# Patient Record
Sex: Male | Born: 1945 | Race: White | Hispanic: No | Marital: Married | Smoking: Former smoker
Health system: Southern US, Community
[De-identification: ages and names within clinical notes are randomized; demographics above are authoritative.]

## PROBLEM LIST (undated history)

## (undated) DIAGNOSIS — I8290 Acute embolism and thrombosis of unspecified vein: Secondary | ICD-10-CM

## (undated) DIAGNOSIS — M1990 Unspecified osteoarthritis, unspecified site: Secondary | ICD-10-CM

## (undated) HISTORY — PX: NECK SURGERY: SHX720

## (undated) HISTORY — PX: VASCULAR SURGERY: SHX849

---

## 1995-10-30 DIAGNOSIS — I8290 Acute embolism and thrombosis of unspecified vein: Secondary | ICD-10-CM

## 1995-10-30 HISTORY — DX: Acute embolism and thrombosis of unspecified vein: I82.90

## 2013-08-06 DIAGNOSIS — Z23 Encounter for immunization: Secondary | ICD-10-CM | POA: Diagnosis not present

## 2013-10-15 ENCOUNTER — Ambulatory Visit (HOSPITAL_COMMUNITY)
Admission: RE | Admit: 2013-10-15 | Discharge: 2013-10-15 | Disposition: A | Discharge status: Home / Self Care | Payer: Medicare Other | Source: Ambulatory Visit | Attending: Internal Medicine | Admitting: Internal Medicine

## 2013-10-15 ENCOUNTER — Other Ambulatory Visit (HOSPITAL_COMMUNITY): Payer: Self-pay | Admitting: Internal Medicine

## 2013-10-15 DIAGNOSIS — M79609 Pain in unspecified limb: Secondary | ICD-10-CM | POA: Diagnosis not present

## 2013-10-15 DIAGNOSIS — M1990 Unspecified osteoarthritis, unspecified site: Secondary | ICD-10-CM | POA: Diagnosis not present

## 2013-10-15 DIAGNOSIS — M19049 Primary osteoarthritis, unspecified hand: Secondary | ICD-10-CM | POA: Diagnosis not present

## 2013-10-15 DIAGNOSIS — M79644 Pain in right finger(s): Secondary | ICD-10-CM

## 2014-08-31 DIAGNOSIS — Z23 Encounter for immunization: Secondary | ICD-10-CM | POA: Diagnosis not present

## 2015-08-02 DIAGNOSIS — Z23 Encounter for immunization: Secondary | ICD-10-CM | POA: Diagnosis not present

## 2015-08-10 DIAGNOSIS — R7301 Impaired fasting glucose: Secondary | ICD-10-CM | POA: Diagnosis not present

## 2015-08-10 DIAGNOSIS — Z125 Encounter for screening for malignant neoplasm of prostate: Secondary | ICD-10-CM | POA: Diagnosis not present

## 2015-08-10 DIAGNOSIS — Z0000 Encounter for general adult medical examination without abnormal findings: Secondary | ICD-10-CM | POA: Diagnosis not present

## 2015-08-18 DIAGNOSIS — I491 Atrial premature depolarization: Secondary | ICD-10-CM | POA: Diagnosis not present

## 2015-08-18 DIAGNOSIS — Z6832 Body mass index (BMI) 32.0-32.9, adult: Secondary | ICD-10-CM | POA: Diagnosis not present

## 2015-08-18 DIAGNOSIS — Z23 Encounter for immunization: Secondary | ICD-10-CM | POA: Diagnosis not present

## 2015-08-18 DIAGNOSIS — R7301 Impaired fasting glucose: Secondary | ICD-10-CM | POA: Diagnosis not present

## 2015-08-22 ENCOUNTER — Encounter (INDEPENDENT_AMBULATORY_CARE_PROVIDER_SITE_OTHER): Payer: Self-pay | Admitting: *Deleted

## 2015-08-25 DIAGNOSIS — D225 Melanocytic nevi of trunk: Secondary | ICD-10-CM | POA: Diagnosis not present

## 2015-08-25 DIAGNOSIS — D485 Neoplasm of uncertain behavior of skin: Secondary | ICD-10-CM | POA: Diagnosis not present

## 2015-11-15 ENCOUNTER — Other Ambulatory Visit (INDEPENDENT_AMBULATORY_CARE_PROVIDER_SITE_OTHER): Payer: Self-pay | Admitting: *Deleted

## 2015-11-15 ENCOUNTER — Encounter (INDEPENDENT_AMBULATORY_CARE_PROVIDER_SITE_OTHER): Payer: Self-pay | Admitting: *Deleted

## 2015-11-15 DIAGNOSIS — Z1211 Encounter for screening for malignant neoplasm of colon: Secondary | ICD-10-CM

## 2015-12-08 ENCOUNTER — Encounter (INDEPENDENT_AMBULATORY_CARE_PROVIDER_SITE_OTHER): Payer: Self-pay | Admitting: *Deleted

## 2015-12-08 ENCOUNTER — Other Ambulatory Visit (INDEPENDENT_AMBULATORY_CARE_PROVIDER_SITE_OTHER): Payer: Self-pay | Admitting: *Deleted

## 2015-12-08 MED ORDER — PEG 3350-KCL-NA BICARB-NACL 420 G PO SOLR: 4000.0000 mL | Freq: Once | ORAL | Status: DC

## 2015-12-08 NOTE — Telephone Encounter (Signed)
Patient needs trilyte 

## 2015-12-16 ENCOUNTER — Telehealth (INDEPENDENT_AMBULATORY_CARE_PROVIDER_SITE_OTHER): Payer: Self-pay | Admitting: *Deleted

## 2015-12-16 NOTE — Telephone Encounter (Signed)
Referring MD/PCP: fagan   Procedure: tcs  Reason/Indication:  screeing  Has patient had this procedure before?  Yes, over 10 yrs ago  If so, when, by whom and where?    Is there a family history of colon cancer?  no  Who?  What age when diagnosed?    Is patient diabetic?   no      Does patient have prosthetic heart valve or mechanical valve?  no  Do you have a pacemaker?  no  Has patient ever had endocarditis? no  Has patient had joint replacement within last 12 months?  no  Does patient tend to be constipated or take laxatives? no  Does patient have a history of alcohol/drug use?  no  Is patient on Coumadin, Plavix and/or Aspirin? yes  Medications: asa 81 mg twice a week  Allergies: nkda  Medication Adjustment: asa 2 days  Procedure date & time: 01/11/16 at 730

## 2015-12-20 NOTE — Telephone Encounter (Signed)
agree

## 2015-12-26 DIAGNOSIS — B079 Viral wart, unspecified: Secondary | ICD-10-CM | POA: Diagnosis not present

## 2015-12-26 DIAGNOSIS — D485 Neoplasm of uncertain behavior of skin: Secondary | ICD-10-CM | POA: Diagnosis not present

## 2016-01-11 ENCOUNTER — Encounter (HOSPITAL_COMMUNITY)
Admission: RE | Disposition: A | Discharge status: Home / Self Care | Payer: Self-pay | Source: Ambulatory Visit | Attending: Internal Medicine

## 2016-01-11 ENCOUNTER — Ambulatory Visit (HOSPITAL_COMMUNITY)
Admission: RE | Admit: 2016-01-11 | Discharge: 2016-01-11 | Disposition: A | Discharge status: Home / Self Care | Payer: Medicare Other | Source: Ambulatory Visit | Attending: Internal Medicine | Admitting: Internal Medicine

## 2016-01-11 ENCOUNTER — Encounter (HOSPITAL_COMMUNITY): Payer: Self-pay | Admitting: *Deleted

## 2016-01-11 DIAGNOSIS — K5730 Diverticulosis of large intestine without perforation or abscess without bleeding: Secondary | ICD-10-CM | POA: Insufficient documentation

## 2016-01-11 DIAGNOSIS — Z1211 Encounter for screening for malignant neoplasm of colon: Secondary | ICD-10-CM | POA: Diagnosis not present

## 2016-01-11 DIAGNOSIS — K648 Other hemorrhoids: Secondary | ICD-10-CM | POA: Insufficient documentation

## 2016-01-11 DIAGNOSIS — M1991 Primary osteoarthritis, unspecified site: Secondary | ICD-10-CM | POA: Diagnosis not present

## 2016-01-11 DIAGNOSIS — Z87891 Personal history of nicotine dependence: Secondary | ICD-10-CM | POA: Diagnosis not present

## 2016-01-11 DIAGNOSIS — Z7982 Long term (current) use of aspirin: Secondary | ICD-10-CM | POA: Diagnosis not present

## 2016-01-11 DIAGNOSIS — Q438 Other specified congenital malformations of intestine: Secondary | ICD-10-CM | POA: Diagnosis not present

## 2016-01-11 HISTORY — DX: Acute embolism and thrombosis of unspecified vein: I82.90

## 2016-01-11 HISTORY — PX: COLONOSCOPY: SHX5424

## 2016-01-11 HISTORY — DX: Unspecified osteoarthritis, unspecified site: M19.90

## 2016-01-11 SURGERY — COLONOSCOPY
Anesthesia: Moderate Sedation

## 2016-01-11 MED ORDER — MEPERIDINE HCL 50 MG/ML IJ SOLN
INTRAMUSCULAR | Status: DC | PRN
  Administered 2016-01-11 (×3): 25 mg via INTRAVENOUS

## 2016-01-11 MED ORDER — SODIUM CHLORIDE 0.9 % IV SOLN
INTRAVENOUS | Status: DC
  Administered 2016-01-11: 1000 mL via INTRAVENOUS

## 2016-01-11 MED ORDER — SIMETHICONE 40 MG/0.6ML PO SUSP
ORAL | Status: DC | PRN
  Administered 2016-01-11: 07:00:00

## 2016-01-11 MED ORDER — MIDAZOLAM HCL 5 MG/5ML IJ SOLN
INTRAMUSCULAR | Status: DC | PRN
  Administered 2016-01-11 (×5): 2 mg via INTRAVENOUS

## 2016-01-11 NOTE — Op Note (Signed)
[facility] Patient Name: Andrew Newton Procedure Date: 01/11/2016 7:25 AM MRN: [id number] Date of Birth: 1946/05/30 Attending MD: Hildred Laser , MD CSN: [id number] Age: 70 Admit Type: Outpatient Procedure:                Colonoscopy Indications:              Screening for colorectal malignant neoplasm Providers:                Hildred Laser, MD, Lurline Del, RN, Isabella Stalling,                            Technician Referring MD:             Asencion Noble, MD Medicines:                Meperidine 75 mg IV, Midazolam 10 mg IV Complications:            No immediate complications. Estimated Blood Loss:     Estimated blood loss: none. Procedure:                Pre-Anesthesia Assessment:                           - Prior to the procedure, a History and Physical                            was performed, and patient medications and                            allergies were reviewed. The patient's tolerance of                            previous anesthesia was also reviewed. The risks                            and benefits of the procedure and the sedation                            options and risks were discussed with the patient.                            All questions were answered, and informed consent                            was obtained. Prior Anticoagulants: The patient                            last took aspirin 3 days prior to the procedure.                            ASA Grade Assessment: I - A normal, healthy                            patient. After reviewing the risks and benefits,  the patient was deemed in satisfactory condition to                            undergo the procedure.                           After obtaining informed consent, the colonoscope                            was passed under direct vision. Throughout the                            procedure, the patient's blood pressure, pulse, and                            oxygen  saturations were monitored continuously. The                            [id number]) scope was introduced through                            the anus and advanced to the the hepatic flexure.                            The [id number]) scope was introduced                            through the and advanced to the. The colonoscopy                            was technically difficult and complex due to                            significant looping. Successful completion of the                            procedure was aided by changing the patient to a                            supine position. The patient tolerated the                            procedure well. The quality of the bowel                            preparation was excellent. The rectum was                            photographed. Scope In: 7:44:33 AM Scope Out: 8:22:49 AM Total Procedure Duration: 0 hours 38 minutes 16 seconds  Findings:      Scattered medium-mouthed diverticula were found in the sigmoid colon.      Non-bleeding internal hemorrhoids were found during retroflexion. The       hemorrhoids were small. Impression:               -  Diverticulosis in the sigmoid colon.                           - Non-bleeding internal hemorrhoids.                           - No specimens collected.                           - Incomplete exam to hepatic flexure Moderate Sedation:      Moderate (conscious) sedation was administered by the endoscopy nurse       and supervised by the endoscopist. The following parameters were       monitored: oxygen saturation, heart rate, blood pressure, CO2       capnography and response to care. Total physician intraservice time was       46 minutes. Recommendation:           - Patient has a contact number available for                            emergencies. The signs and symptoms of potential                            delayed complications were discussed with the                             patient. Return to normal activities tomorrow.                            Written discharge instructions were provided to the                            patient.                           - High fiber diet today.                           - Perform a virtual colonoscopy in 4 weeks.                           - Continue present medications. Procedure Code(s):        --- Professional ---                           [phone], Colonoscopy, flexible; diagnostic, including                            collection of specimen(s) by brushing or washing,                            when performed (separate procedure)                           99153, Moderate sedation services; each additional  15 minutes intraservice time                           99153, Moderate sedation services; each additional                            15 minutes intraservice time                           G0500, Moderate sedation services provided by the                            same physician or other qualified health care                            professional performing a gastrointestinal                            endoscopic service that sedation supports,                            requiring the presence of an independent trained                            observer to assist in the monitoring of the                            patient's level of consciousness and physiological                            status; initial 15 minutes of intra-service time;                            patient age 51 years or older (additional time may                            be reported with [phone], as appropriate) Diagnosis Code(s):        --- Professional ---                           Z12.11, Encounter for screening for malignant                            neoplasm of colon                           K64.8, Other hemorrhoids                           K57.30, Diverticulosis of large intestine without                             perforation or abscess without bleeding CPT copyright 2016 American Medical Association. All rights reserved. The codes documented in this report are preliminary and upon coder review may  be revised to meet current compliance requirements.  Hildred Laser, MD Hildred Laser, MD 01/11/2016 8:44:09 AM This report has been signed electronically. Number of Addenda: 0

## 2016-01-11 NOTE — H&P (Signed)
Tayten Chubb is an 70 y.o. male.   Chief Complaint: Patient is here for colonoscopy. HPI: Patient is 69 year old Caucasian male who is in for screening colonoscopy. Last exam was normal 2 years ago. He denies abdominal pain change in bowel habits or rectal bleeding. History is negative for CRC.  Past Medical History  Diagnosis Date  . Arthritis   . Blood clot in vein 1997    Past Surgical History  Procedure Laterality Date  . Neck surgery    . Vascular surgery      History reviewed. No pertinent family history. Social History:  reports that he has quit smoking. His smoking use included Cigarettes. He has a 1.25 pack-year smoking history. He does not have any smokeless tobacco history on file. He reports that he drinks alcohol. He reports that he does not use illicit drugs.  Allergies: Not on File  Medications Prior to Admission  Medication Sig Dispense Refill  . aspirin EC 81 MG tablet Take 81 mg by mouth daily.    . polyethylene glycol-electrolytes (NULYTELY/GOLYTELY) 420 g solution Take 4,000 mLs by mouth once. 4000 mL 0    No results found for this or any previous visit (from the past 48 hour(s)). No results found.  ROS  Blood pressure 152/98, pulse 81, temperature 97.9 F (36.6 C), temperature source Oral, resp. rate 15, height 6' (1.829 m), weight 220 lb (99.791 kg), SpO2 98 %. Physical Exam  Constitutional: He appears well-developed and well-nourished.  HENT:  Mouth/Throat: Oropharynx is clear and moist.  Eyes: Conjunctivae are normal. No scleral icterus.  Neck: No thyromegaly present.  Cardiovascular: Normal rate, regular rhythm and normal heart sounds.   No murmur heard. Respiratory: Effort normal and breath sounds normal.  GI: Soft. He exhibits no distension and no mass. There is no tenderness.  Musculoskeletal: He exhibits no edema.  Lymphadenopathy:    He has no cervical adenopathy.  Neurological: He is alert.  Skin: Skin is warm and dry.      Assessment/Plan Average risk screening colonoscopy.  Rogene Houston, MD 01/11/2016, 7:32 AM

## 2016-01-11 NOTE — Discharge Instructions (Signed)
Resume usual medications and high fiber diet. No driving for 24 hours. Virtual colonoscopy to be scheduled. Office will call. Colonoscopy, Care After Refer to this sheet in the next few weeks. These instructions provide you with information on caring for yourself after your procedure. Your health care provider may also give you more specific instructions. Your treatment has been planned according to current medical practices, but problems sometimes occur. Call your health care provider if you have any problems or questions after your procedure. WHAT TO EXPECT AFTER THE PROCEDURE  After your procedure, it is typical to have the following:  A small amount of blood in your stool.  Moderate amounts of gas and mild abdominal cramping or bloating. HOME CARE INSTRUCTIONS  Do not drive, operate machinery, or sign important documents for 24 hours.  You may shower and resume your regular physical activities, but move at a slower pace for the first 24 hours.  Take frequent rest periods for the first 24 hours.  Walk around or put a warm pack on your abdomen to help reduce abdominal cramping and bloating.  Drink enough fluids to keep your urine clear or pale yellow.  You may resume your normal diet as instructed by your health care provider. Avoid heavy or fried foods that are hard to digest.  Avoid drinking alcohol for 24 hours or as instructed by your health care provider.  Only take over-the-counter or prescription medicines as directed by your health care provider.  If a tissue sample (biopsy) was taken during your procedure:  Do not take aspirin or blood thinners for 7 days, or as instructed by your health care provider.  Do not drink alcohol for 7 days, or as instructed by your health care provider.  Eat soft foods for the first 24 hours. SEEK MEDICAL CARE IF: You have persistent spotting of blood in your stool 2-3 days after the procedure. SEEK IMMEDIATE MEDICAL CARE IF:  You have  more than a small spotting of blood in your stool.  You pass large blood clots in your stool.  Your abdomen is swollen (distended).  You have nausea or vomiting.  You have a fever.  You have increasing abdominal pain that is not relieved with medicine.   This information is not intended to replace advice given to you by your health care provider. Make sure you discuss any questions you have with your health care provider.   Document Released: 05/29/2004 Document Revised: 08/05/2013 Document Reviewed: 06/22/2013 Elsevier Interactive Patient Education 2016 [employer]. Diverticulosis Diverticulosis is the condition that develops when small pouches (diverticula) form in the wall of your colon. Your colon, or large intestine, is where water is absorbed and stool is formed. The pouches form when the inside layer of your colon pushes through weak spots in the outer layers of your colon. CAUSES  No one knows exactly what causes diverticulosis. RISK FACTORS  Being older than 7. Your risk for this condition increases with age. Diverticulosis is rare in people younger than 40 years. By age 47, almost everyone has it.  Eating a low-fiber diet.  Being frequently constipated.  Being overweight.  Not getting enough exercise.  Smoking.  Taking over-the-counter pain medicines, like aspirin and ibuprofen. SYMPTOMS  Most people with diverticulosis do not have symptoms. DIAGNOSIS  Because diverticulosis often has no symptoms, health care providers often discover the condition during an exam for other colon problems. In many cases, a health care provider will diagnose diverticulosis while using a flexible scope to  examine the colon (colonoscopy). TREATMENT  If you have never developed an infection related to diverticulosis, you may not need treatment. If you have had an infection before, treatment may include:  Eating more fruits, vegetables, and grains.  Taking a fiber  supplement.  Taking a live bacteria supplement (probiotic).  Taking medicine to relax your colon. HOME CARE INSTRUCTIONS   Drink at least 6-8 glasses of water each day to prevent constipation.  Try not to strain when you have a bowel movement.  Keep all follow-up appointments. If you have had an infection before:  Increase the fiber in your diet as directed by your health care provider or dietitian.  Take a dietary fiber supplement if your health care provider approves.  Only take medicines as directed by your health care provider. SEEK MEDICAL CARE IF:   You have abdominal pain.  You have bloating.  You have cramps.  You have not gone to the bathroom in 3 days. SEEK IMMEDIATE MEDICAL CARE IF:   Your pain gets worse.  Yourbloating becomes very bad.  You have a fever or chills, and your symptoms suddenly get worse.  You begin vomiting.  You have bowel movements that are bloody or black. MAKE SURE YOU:  Understand these instructions.  Will watch your condition.  Will get help right away if you are not doing well or get worse.   This information is not intended to replace advice given to you by your health care provider. Make sure you discuss any questions you have with your health care provider.   Document Released: 07/12/2004 Document Revised: 10/20/2013 Document Reviewed: 09/09/2013 Elsevier Interactive Patient Education [employer].

## 2016-01-12 DIAGNOSIS — X32XXXA Exposure to sunlight, initial encounter: Secondary | ICD-10-CM | POA: Diagnosis not present

## 2016-01-12 DIAGNOSIS — L570 Actinic keratosis: Secondary | ICD-10-CM | POA: Diagnosis not present

## 2016-01-12 DIAGNOSIS — L280 Lichen simplex chronicus: Secondary | ICD-10-CM | POA: Diagnosis not present

## 2016-01-13 ENCOUNTER — Telehealth (INDEPENDENT_AMBULATORY_CARE_PROVIDER_SITE_OTHER): Payer: Self-pay | Admitting: *Deleted

## 2016-01-13 ENCOUNTER — Encounter (INDEPENDENT_AMBULATORY_CARE_PROVIDER_SITE_OTHER): Payer: Self-pay | Admitting: Internal Medicine

## 2016-01-13 DIAGNOSIS — Q438 Other specified congenital malformations of intestine: Secondary | ICD-10-CM

## 2016-01-13 NOTE — Telephone Encounter (Signed)
Will do BE

## 2016-01-13 NOTE — Telephone Encounter (Signed)
Medicare won't cover virtual TCS for patient, do you want to do a barium enema instead -- please advise

## 2016-01-13 NOTE — Progress Notes (Signed)
This encounter was created in error - please disregard.

## 2016-01-16 ENCOUNTER — Encounter (HOSPITAL_COMMUNITY): Payer: Self-pay | Admitting: Internal Medicine

## 2016-01-16 ENCOUNTER — Encounter (INDEPENDENT_AMBULATORY_CARE_PROVIDER_SITE_OTHER): Payer: Self-pay | Admitting: *Deleted

## 2016-01-16 ENCOUNTER — Other Ambulatory Visit (INDEPENDENT_AMBULATORY_CARE_PROVIDER_SITE_OTHER): Payer: Self-pay | Admitting: Internal Medicine

## 2016-01-16 DIAGNOSIS — Q438 Other specified congenital malformations of intestine: Secondary | ICD-10-CM

## 2016-01-16 NOTE — Telephone Encounter (Signed)
BE scheduled 02/13/16 at 945, detailed instruction sheet mailed to patient

## 2016-02-13 ENCOUNTER — Ambulatory Visit (HOSPITAL_COMMUNITY)
Admission: RE | Admit: 2016-02-13 | Discharge: 2016-02-13 | Disposition: A | Discharge status: Home / Self Care | Payer: Medicare Other | Source: Ambulatory Visit | Attending: Internal Medicine | Admitting: Internal Medicine

## 2016-02-13 DIAGNOSIS — M5136 Other intervertebral disc degeneration, lumbar region: Secondary | ICD-10-CM | POA: Insufficient documentation

## 2016-02-13 DIAGNOSIS — Q438 Other specified congenital malformations of intestine: Secondary | ICD-10-CM | POA: Insufficient documentation

## 2016-02-13 DIAGNOSIS — K5730 Diverticulosis of large intestine without perforation or abscess without bleeding: Secondary | ICD-10-CM | POA: Diagnosis not present

## 2016-08-14 DIAGNOSIS — Z23 Encounter for immunization: Secondary | ICD-10-CM | POA: Diagnosis not present

## 2016-09-04 DIAGNOSIS — Z125 Encounter for screening for malignant neoplasm of prostate: Secondary | ICD-10-CM | POA: Diagnosis not present

## 2016-09-04 DIAGNOSIS — R7301 Impaired fasting glucose: Secondary | ICD-10-CM | POA: Diagnosis not present

## 2016-09-06 DIAGNOSIS — R7301 Impaired fasting glucose: Secondary | ICD-10-CM | POA: Diagnosis not present

## 2016-09-06 DIAGNOSIS — K4090 Unilateral inguinal hernia, without obstruction or gangrene, not specified as recurrent: Secondary | ICD-10-CM | POA: Diagnosis not present

## 2016-09-06 DIAGNOSIS — Z6835 Body mass index (BMI) 35.0-35.9, adult: Secondary | ICD-10-CM | POA: Diagnosis not present

## 2016-09-06 DIAGNOSIS — I491 Atrial premature depolarization: Secondary | ICD-10-CM | POA: Diagnosis not present

## 2016-09-06 DIAGNOSIS — Z23 Encounter for immunization: Secondary | ICD-10-CM | POA: Diagnosis not present

## 2016-10-01 DIAGNOSIS — H2513 Age-related nuclear cataract, bilateral: Secondary | ICD-10-CM | POA: Diagnosis not present

## 2017-05-14 ENCOUNTER — Encounter: Payer: Self-pay | Admitting: General Surgery

## 2017-05-14 ENCOUNTER — Ambulatory Visit (INDEPENDENT_AMBULATORY_CARE_PROVIDER_SITE_OTHER): Payer: Medicare Other | Admitting: General Surgery

## 2017-05-14 VITALS — BP 143/90 | HR 81 | Temp 98.4°F | Resp 18 | Ht 72.0 in | Wt 253.0 lb

## 2017-05-14 DIAGNOSIS — K4090 Unilateral inguinal hernia, without obstruction or gangrene, not specified as recurrent: Secondary | ICD-10-CM | POA: Diagnosis not present

## 2017-05-14 NOTE — Progress Notes (Signed)
Andrew Newton; [id number]; [birth year]   HPI Patient is a 71 year old white male who was referred to my care by Dr. Willey Newton for evaluation and treatment of a left inguinal hernia. It was first noticed on physical examination last year. Over the past few months, it has been enlarging in size and causes him discomfort when it is sticking out. It is made worse with straining or standing. It reduces on its own when lying down. The patient currently has no pain. Past Medical History:  Diagnosis Date  . Arthritis   . Blood clot in vein 1997    Past Surgical History:  Procedure Laterality Date  . COLONOSCOPY N/A 01/11/2016   Procedure: COLONOSCOPY;  Surgeon: Andrew Houston, MD;  Location: [facility];  Service: Endoscopy;  Laterality: N/A;  730  . NECK SURGERY    . VASCULAR SURGERY      History reviewed. No pertinent family history.  Current Outpatient Prescriptions on File Prior to Visit  Medication Sig Dispense Refill  . aspirin EC 81 MG tablet Take 81 mg by mouth daily.     No current facility-administered medications on file prior to visit.     No Known Allergies  History  Alcohol Use  . Yes    Comment: Occasional beer    History  Smoking Status  . Former Smoker  . Packs/day: 0.25  . Years: 5.00  . Types: Cigarettes  Smokeless Tobacco  . Never Used    Review of Systems  Constitutional: Negative.   HENT: Negative.   Eyes: Negative.   Respiratory: Negative.   Cardiovascular: Negative.   Gastrointestinal: Negative.   Genitourinary: Negative.   Musculoskeletal: Negative.   Skin: Negative.   Neurological: Negative.   Endo/Heme/Allergies: Negative.   Psychiatric/Behavioral: Negative.     Objective   Vitals:   05/14/17 1115  BP: (!) 143/90  Pulse: 81  Resp: 18  Temp: 98.4 F (36.9 C)    Physical Exam  Constitutional: He is oriented to person, place, and time and well-developed, well-nourished, and in no distress.  HENT:  Head: Normocephalic and  atraumatic.  Neck: Normal range of motion. Neck supple.  Cardiovascular: Normal rate, regular rhythm and normal heart sounds.   No murmur heard. Pulmonary/Chest: Effort normal and breath sounds normal. He has no wheezes. He has no rales.  Abdominal: Soft. Bowel sounds are normal. He exhibits no distension. There is no tenderness.  Easily reducible left inguinal hernia.  Genitourinary:  Genitourinary Comments: Genitourinary examination within normal limits.  Neurological: He is alert and oriented to person, place, and time.  Skin: Skin is warm and dry.  Vitals reviewed.   Assessment  Left inguinal hernia, reducible Plan   Patient is scheduled for left inguinal herniorrhaphy with mesh on 05/24/2017. The risks and benefits of the procedure including bleeding, infection, mesh use, and the possibility of recurrence of the hernia were fully explained to the patient, who gave informed consent.

## 2017-05-14 NOTE — Patient Instructions (Signed)

## 2017-05-14 NOTE — H&P (Addendum)
Andrew Newton; [id number]; 05/10/1946   HPI Patient is a 71 year old white male who was referred to my care by Dr. Willey Blade for evaluation and treatment of a left inguinal hernia. It was first noticed on physical examination last year. Over the past few months, it has been enlarging in size and causes him discomfort when it is sticking out. It is made worse with straining or standing. It reduces on its own when lying down. The patient currently has no pain. Past Medical History:  Diagnosis Date  . Arthritis   . Blood clot in vein 1997    Past Surgical History:  Procedure Laterality Date  . COLONOSCOPY N/A 01/11/2016   Procedure: COLONOSCOPY;  Surgeon: Rogene Houston, MD;  Location: [facility];  Service: Endoscopy;  Laterality: N/A;  730  . NECK SURGERY    . VASCULAR SURGERY      History reviewed. No pertinent family history.  Current Outpatient Prescriptions on File Prior to Visit  Medication Sig Dispense Refill  . aspirin EC 81 MG tablet Take 81 mg by mouth daily.     No current facility-administered medications on file prior to visit.     No Known Allergies  History  Alcohol Use  . Yes    Comment: Occasional beer    History  Smoking Status  . Former Smoker  . Packs/day: 0.25  . Years: 5.00  . Types: Cigarettes  Smokeless Tobacco  . Never Used    Review of Systems  Constitutional: Negative.   HENT: Negative.   Eyes: Negative.   Respiratory: Negative.   Cardiovascular: Negative.   Gastrointestinal: Negative.   Genitourinary: Negative.   Musculoskeletal: Negative.   Skin: Negative.   Neurological: Negative.   Endo/Heme/Allergies: Negative.   Psychiatric/Behavioral: Negative.     Objective   Vitals:   05/14/17 1115  BP: (!) 143/90  Pulse: 81  Resp: 18  Temp: 98.4 F (36.9 C)    Physical Exam  Constitutional: He is oriented to person, place, and time and well-developed, well-nourished, and in no distress.  HENT:  Head: Normocephalic and  atraumatic.  Neck: Normal range of motion. Neck supple.  Cardiovascular: Normal rate, regular rhythm and normal heart sounds.   No murmur heard. Pulmonary/Chest: Effort normal and breath sounds normal. He has no wheezes. He has no rales.  Abdominal: Soft. Bowel sounds are normal. He exhibits no distension. There is no tenderness.  Easily reducible left inguinal hernia.  Genitourinary:  Genitourinary Comments: Genitourinary examination within normal limits.  Neurological: He is alert and oriented to person, place, and time.  Skin: Skin is warm and dry.  Vitals reviewed.   Assessment  Left inguinal hernia, reducible Plan   Patient is scheduled for left inguinal herniorrhaphy with mesh on 05/24/2017. The risks and benefits of the procedure including bleeding, infection, mesh use, and the possibility of recurrence of the hernia were fully explained to the patient, who gave informed consent.

## 2017-05-21 NOTE — Patient Instructions (Signed)
Andrew Newton  05/21/2017     @PREFPERIOPPHARMACY @   Your procedure is scheduled on  05/24/2017   Report to [facility] at  615   A.M.  Call this number if you have problems the morning of surgery:  [phone]   Remember:  Do not eat food or drink liquids after midnight.  Take these medicines the morning of surgery with A SIP OF WATER  None   Do not wear jewelry, make-up or nail polish.  Do not wear lotions, powders, or perfumes, or deoderant.  Do not shave 48 hours prior to surgery.  Men may shave face and neck.  Do not bring valuables to the hospital.  [facility] is not responsible for any belongings or valuables.  Contacts, dentures or bridgework may not be worn into surgery.  Leave your suitcase in the car.  After surgery it may be brought to your room.  For patients admitted to the hospital, discharge time will be determined by your treatment team.  Patients discharged the day of surgery will not be allowed to drive home.   Name and phone number of your driver:   family Special instructions:  None  Please read over the following fact sheets that you were given. Anesthesia Post-op Instructions and Care and Recovery After Surgery       Open Hernia Repair, Adult Open hernia repair is a surgical procedure to fix a hernia. A hernia occurs when an internal organ or tissue pushes out through a weak spot in the abdominal wall muscles. Hernias commonly occur in the groin and around the navel. Most hernias tend to get worse over time. Often, surgery is done to prevent the hernia from becoming bigger, uncomfortable, or an emergency. Emergency surgery may be needed if abdominal contents get stuck in the opening (incarcerated hernia) or the blood supply gets cut off (strangulated hernia). In an open repair, an incision is made in the abdomen to perform the surgery. Tell a health care provider about:  Any allergies you have.  All medicines you are  taking, including vitamins, herbs, eye drops, creams, and over-the-counter medicines.  Any problems you or family members have had with anesthetic medicines.  Any blood or bone disorders you have.  Any surgeries you have had.  Any medical conditions you have, including any recent cold or flu symptoms.  Whether you are pregnant or may be pregnant. What are the risks? Generally, this is a safe procedure. However, problems may occur, including:  Long-lasting (chronic) pain.  Bleeding.  Infection.  Damage to the testicle. This can cause shrinking or swelling.  Damage to the bladder, blood vessels, intestine, or nerves near the hernia.  Trouble passing urine.  Allergic reactions to medicines.  Return of the hernia.  What happens before the procedure? Staying hydrated Follow instructions from your health care provider about hydration, which may include:  Up to 2 hours before the procedure - you may continue to drink clear liquids, such as water, clear fruit juice, black coffee, and plain tea.  Eating and drinking restrictions Follow instructions from your health care provider about eating and drinking, which may include:  8 hours before the procedure - stop eating heavy meals or foods such as meat, fried foods, or fatty foods.  6 hours before the procedure - stop eating light meals or foods, such as toast or cereal.  6 hours before the procedure - stop drinking milk or drinks  that contain milk.  2 hours before the procedure - stop drinking clear liquids.  Medicines  Ask your health care provider about: ? Changing or stopping your regular medicines. This is especially important if you are taking diabetes medicines or blood thinners. ? Taking medicines such as aspirin and ibuprofen. These medicines can thin your blood. Do not take these medicines before your procedure if your health care provider instructs you not to.  You may be given antibiotic medicine to help prevent  infection. General instructions  You may have blood tests or imaging studies.  Ask your health care provider how your surgical site will be marked or identified.  If you smoke, do not smoke for at least 2 weeks before your procedure or for as long as told by your health care provider.  Let your health care provider know if you develop a cold or any infection before your surgery.  Plan to have someone take you home from the hospital or clinic.  If you will be going home right after the procedure, plan to have someone with you for 24 hours. What happens during the procedure?  To reduce your risk of infection: ? Your health care team will wash or sanitize their hands. ? Your skin will be washed with soap. ? Hair may be removed from the surgical area.  An IV tube will be inserted into one of your veins.  You will be given one or more of the following: ? A medicine to help you relax (sedative). ? A medicine to numb the area (local anesthetic). ? A medicine to make you fall asleep (general anesthetic).  Your surgeon will make an incision over the hernia.  The tissues of the hernia will be moved back into place.  The edges of the hernia may be stitched together.  The opening in the abdominal muscles will be closed with stitches (sutures). Or, your surgeon will place a mesh patch made of manmade (synthetic) material over the opening.  The incision will be closed.  A bandage (dressing) may be placed over the incision. The procedure may vary among health care providers and hospitals. What happens after the procedure?  Your blood pressure, heart rate, breathing rate, and blood oxygen level will be monitored until the medicines you were given have worn off.  You may be given medicine for pain.  Do not drive for 24 hours if you received a sedative. This information is not intended to replace advice given to you by your health care provider. Make sure you discuss any questions you  have with your health care provider. Document Released: 04/10/2001 Document Revised: 05/04/2016 Document Reviewed: 03/28/2016 Elsevier Interactive Patient Education  2018 [place] Repair, Adult, Care After This sheet gives you information about how to care for yourself after your procedure. Your health care provider may also give you more specific instructions. If you have problems or questions, contact your health care provider. What can I expect after the procedure? After the procedure, it is common to have:  Mild discomfort.  Slight bruising.  Minor swelling.  Pain in the abdomen.  Follow these instructions at home: Incision care   Follow instructions from your health care provider about how to take care of your incision area. Make sure you: ? Wash your hands with soap and water before you change your bandage (dressing). If soap and water are not available, use hand sanitizer. ? Change your dressing as told by your health care provider. ? Leave  stitches (sutures), skin glue, or adhesive strips in place. These skin closures may need to stay in place for 2 weeks or longer. If adhesive strip edges start to loosen and curl up, you may trim the loose edges. Do not remove adhesive strips completely unless your health care provider tells you to do that.  Check your incision area every day for signs of infection. Check for: ? More redness, swelling, or pain. ? More fluid or blood. ? Warmth. ? Pus or a bad smell. Activity  Do not drive or use heavy machinery while taking prescription pain medicine. Do not drive until your health care provider approves.  Until your health care provider approves: ? Do not lift anything that is heavier than 10 lb (4.5 kg). ? Do not play contact sports.  Return to your normal activities as told by your health care provider. Ask your health care provider what activities are safe. General instructions  To prevent or treat constipation  while you are taking prescription pain medicine, your health care provider may recommend that you: ? Drink enough fluid to keep your urine clear or pale yellow. ? Take over-the-counter or prescription medicines. ? Eat foods that are high in fiber, such as fresh fruits and vegetables, whole grains, and beans. ? Limit foods that are high in fat and processed sugars, such as fried and sweet foods.  Take over-the-counter and prescription medicines only as told by your health care provider.  Do not take tub baths or go swimming until your health care provider approves.  Keep all follow-up visits as told by your health care provider. This is important. Contact a health care provider if:  You develop a rash.  You have more redness, swelling, or pain around your incision.  You have more fluid or blood coming from your incision.  Your incision feels warm to the touch.  You have pus or a bad smell coming from your incision.  You have a fever or chills.  You have blood in your stool (feces).  You have not had a bowel movement in 2-3 days.  Your pain is not controlled with medicine. Get help right away if:  You have chest pain or shortness of breath.  You feel light-headed or feel faint.  You have severe pain.  You vomit and your pain is worse. This information is not intended to replace advice given to you by your health care provider. Make sure you discuss any questions you have with your health care provider. Document Released: 05/04/2005 Document Revised: 05/04/2016 Document Reviewed: 03/28/2016 Elsevier Interactive Patient Education  2017 [place] Repair, Adult, Care After These instructions give you information about caring for yourself after your procedure. Your doctor may also give you more specific instructions. If you have problems or questions, contact your doctor. Follow these instructions at home: Surgical cut (incision) care   Follow instructions  from your doctor about how to take care of your surgical cut area. Make sure you: ? Wash your hands with soap and water before you change your bandage (dressing). If you cannot use soap and water, use hand sanitizer. ? Change your bandage as told by your doctor. ? Leave stitches (sutures), skin glue, or skin tape (adhesive) strips in place. They may need to stay in place for 2 weeks or longer. If tape strips get loose and curl up, you may trim the loose edges. Do not remove tape strips completely unless your doctor says it is okay.  Check  your surgical cut every day for signs of infection. Check for: ? More redness, swelling, or pain. ? More fluid or blood. ? Warmth. ? Pus or a bad smell. Activity  Do not drive or use heavy machinery while taking prescription pain medicine. Do not drive until your doctor says it is okay.  Until your doctor says it is okay: ? Do not lift anything that is heavier than 10 lb (4.5 kg). ? Do not play contact sports.  Return to your normal activities as told by your doctor. Ask your doctor what activities are safe. General instructions  To prevent or treat having a hard time pooping (constipation) while you are taking prescription pain medicine, your doctor may recommend that you: ? Drink enough fluid to keep your pee (urine) clear or pale yellow. ? Take over-the-counter or prescription medicines. ? Eat foods that are high in fiber, such as fresh fruits and vegetables, whole grains, and beans. ? Limit foods that are high in fat and processed sugars, such as fried and sweet foods.  Take over-the-counter and prescription medicines only as told by your doctor.  Do not take baths, swim, or use a hot tub until your doctor says it is okay.  Keep all follow-up visits as told by your doctor. This is important. Contact a doctor if:  You develop a rash.  You have more redness, swelling, or pain around your surgical cut.  You have more fluid or blood coming from  your surgical cut.  Your surgical cut feels warm to the touch.  You have pus or a bad smell coming from your surgical cut.  You have a fever or chills.  You have blood in your poop (stool).  You have not pooped in 2-3 days.  Medicine does not help your pain. Get help right away if:  You have chest pain or you are short of breath.  You feel light-headed.  You feel weak and dizzy (feel faint).  You have very bad pain.  You throw up (vomit) and your pain is worse. This information is not intended to replace advice given to you by your health care provider. Make sure you discuss any questions you have with your health care provider. Document Released: 11/05/2014 Document Revised: 05/04/2016 Document Reviewed: 03/28/2016 Elsevier Interactive Patient Education  2017 [place] Anesthesia, Adult General anesthesia is the use of medicines to make a person "go to sleep" (be unconscious) for a medical procedure. General anesthesia is often recommended when a procedure:  Is long.  Requires you to be still or in an unusual position.  Is major and can cause you to lose blood.  Is impossible to do without general anesthesia.  The medicines used for general anesthesia are called general anesthetics. In addition to making you sleep, the medicines:  Prevent pain.  Control your blood pressure.  Relax your muscles.  Tell a health care provider about:  Any allergies you have.  All medicines you are taking, including vitamins, herbs, eye drops, creams, and over-the-counter medicines.  Any problems you or family members have had with anesthetic medicines.  Types of anesthetics you have had in the past.  Any bleeding disorders you have.  Any surgeries you have had.  Any medical conditions you have.  Any history of heart or lung conditions, such as heart failure, sleep apnea, or chronic obstructive pulmonary disease (COPD).  Whether you are pregnant or may be  pregnant.  Whether you use tobacco, alcohol, marijuana, or street drugs.  Any history of Armed forces logistics/support/administrative officer.  Any history of depression or anxiety. What are the risks? Generally, this is a safe procedure. However, problems may occur, including:  Allergic reaction to anesthetics.  Lung and heart problems.  Inhaling food or liquids from your stomach into your lungs (aspiration).  Injury to nerves.  Waking up during your procedure and being unable to move (rare).  Extreme agitation or a state of mental confusion (delirium) when you wake up from the anesthetic.  Air in the bloodstream, which can lead to stroke.  These problems are more likely to develop if you are having a major surgery or if you have an advanced medical condition. You can prevent some of these complications by answering all of your health care provider's questions thoroughly and by following all pre-procedure instructions. General anesthesia can cause side effects, including:  Nausea or vomiting  A sore throat from the breathing tube.  Feeling cold or shivery.  Feeling tired, washed out, or achy.  Sleepiness or drowsiness.  Confusion or agitation.  What happens before the procedure? Staying hydrated Follow instructions from your health care provider about hydration, which may include:  Up to 2 hours before the procedure - you may continue to drink clear liquids, such as water, clear fruit juice, black coffee, and plain tea.  Eating and drinking restrictions Follow instructions from your health care provider about eating and drinking, which may include:  8 hours before the procedure - stop eating heavy meals or foods such as meat, fried foods, or fatty foods.  6 hours before the procedure - stop eating light meals or foods, such as toast or cereal.  6 hours before the procedure - stop drinking milk or drinks that contain milk.  2 hours before the procedure - stop drinking clear  liquids.  Medicines  Ask your health care provider about: ? Changing or stopping your regular medicines. This is especially important if you are taking diabetes medicines or blood thinners. ? Taking medicines such as aspirin and ibuprofen. These medicines can thin your blood. Do not take these medicines before your procedure if your health care provider instructs you not to. ? Taking new dietary supplements or medicines. Do not take these during the week before your procedure unless your health care provider approves them.  If you are told to take a medicine or to continue taking a medicine on the day of the procedure, take the medicine with sips of water. General instructions   Ask if you will be going home the same day, the following day, or after a longer hospital stay. ? Plan to have someone take you home. ? Plan to have someone stay with you for the first 24 hours after you leave the hospital or clinic.  For 3-6 weeks before the procedure, try not to use any tobacco products, such as cigarettes, chewing tobacco, and e-cigarettes.  You may brush your teeth on the morning of the procedure, but make sure to spit out the toothpaste. What happens during the procedure?  You will be given anesthetics through a mask and through an IV tube in one of your veins.  You may receive medicine to help you relax (sedative).  As soon as you are asleep, a breathing tube may be used to help you breathe.  An anesthesia specialist will stay with you throughout the procedure. He or she will help keep you comfortable and safe by continuing to give you medicines and adjusting the amount of medicine that you get.  He or she will also watch your blood pressure, pulse, and oxygen levels to make sure that the anesthetics do not cause any problems.  If a breathing tube was used to help you breathe, it will be removed before you wake up. The procedure may vary among health care providers and hospitals. What  happens after the procedure?  You will wake up, often slowly, after the procedure is complete, usually in a recovery area.  Your blood pressure, heart rate, breathing rate, and blood oxygen level will be monitored until the medicines you were given have worn off.  You may be given medicine to help you calm down if you feel anxious or agitated.  If you will be going home the same day, your health care provider may check to make sure you can stand, drink, and urinate.  Your health care providers will treat your pain and side effects before you go home.  Do not drive for 24 hours if you received a sedative.  You may: ? Feel nauseous and vomit. ? Have a sore throat. ? Have mental slowness. ? Feel cold or shivery. ? Feel sleepy. ? Feel tired. ? Feel sore or achy, even in parts of your body where you did not have surgery. This information is not intended to replace advice given to you by your health care provider. Make sure you discuss any questions you have with your health care provider. Document Released: 01/22/2008 Document Revised: 03/27/2016 Document Reviewed: 09/29/2015 Elsevier Interactive Patient Education  2018 [place] Anesthesia, Adult, Care After These instructions provide you with information about caring for yourself after your procedure. Your health care provider may also give you more specific instructions. Your treatment has been planned according to current medical practices, but problems sometimes occur. Call your health care provider if you have any problems or questions after your procedure. What can I expect after the procedure? After the procedure, it is common to have:  Vomiting.  A sore throat.  Mental slowness.  It is common to feel:  Nauseous.  Cold or shivery.  Sleepy.  Tired.  Sore or achy, even in parts of your body where you did not have surgery.  Follow these instructions at home: For at least 24 hours after the  procedure:  Do not: ? Participate in activities where you could fall or become injured. ? Drive. ? Use heavy machinery. ? Drink alcohol. ? Take sleeping pills or medicines that cause drowsiness. ? Make important decisions or sign legal documents. ? Take care of children on your own.  Rest. Eating and drinking  If you vomit, drink water, juice, or soup when you can drink without vomiting.  Drink enough fluid to keep your urine clear or pale yellow.  Make sure you have little or no nausea before eating solid foods.  Follow the diet recommended by your health care provider. General instructions  Have a responsible adult stay with you until you are awake and alert.  Return to your normal activities as told by your health care provider. Ask your health care provider what activities are safe for you.  Take over-the-counter and prescription medicines only as told by your health care provider.  If you smoke, do not smoke without supervision.  Keep all follow-up visits as told by your health care provider. This is important. Contact a health care provider if:  You continue to have nausea or vomiting at home, and medicines are not helpful.  You cannot drink fluids or start eating  again.  You cannot urinate after 8-12 hours.  You develop a skin rash.  You have fever.  You have increasing redness at the site of your procedure. Get help right away if:  You have difficulty breathing.  You have chest pain.  You have unexpected bleeding.  You feel that you are having a life-threatening or urgent problem. This information is not intended to replace advice given to you by your health care provider. Make sure you discuss any questions you have with your health care provider. Document Released: 01/21/2001 Document Revised: 03/19/2016 Document Reviewed: 09/29/2015 Elsevier Interactive Patient Education  [employer].

## 2017-05-22 ENCOUNTER — Encounter (HOSPITAL_COMMUNITY)
Admission: RE | Admit: 2017-05-22 | Discharge: 2017-05-22 | Disposition: A | Discharge status: Home / Self Care | Payer: Medicare Other | Source: Ambulatory Visit | Attending: General Surgery | Admitting: General Surgery

## 2017-05-22 ENCOUNTER — Encounter (HOSPITAL_COMMUNITY): Payer: Self-pay

## 2017-05-22 ENCOUNTER — Other Ambulatory Visit: Payer: Self-pay

## 2017-05-22 DIAGNOSIS — K4090 Unilateral inguinal hernia, without obstruction or gangrene, not specified as recurrent: Secondary | ICD-10-CM | POA: Diagnosis not present

## 2017-05-22 DIAGNOSIS — Z86718 Personal history of other venous thrombosis and embolism: Secondary | ICD-10-CM | POA: Diagnosis not present

## 2017-05-22 DIAGNOSIS — Z7982 Long term (current) use of aspirin: Secondary | ICD-10-CM | POA: Diagnosis not present

## 2017-05-22 DIAGNOSIS — Z87891 Personal history of nicotine dependence: Secondary | ICD-10-CM | POA: Diagnosis not present

## 2017-05-22 LAB — BASIC METABOLIC PANEL
Anion gap: 7 (ref 5–15)
BUN: 16 mg/dL (ref 6–20)
CALCIUM: 8.7 mg/dL — AB (ref 8.9–10.3)
CO2: 23 mmol/L (ref 22–32)
CREATININE: 0.96 mg/dL (ref 0.61–1.24)
Chloride: 107 mmol/L (ref 101–111)
GFR calc non Af Amer: >60 mL/min (ref >60)
Glucose, Bld: 96 mg/dL (ref 65–99)
Potassium: 4.2 mmol/L (ref 3.5–5.1)
SODIUM: 137 mmol/L (ref 135–145)

## 2017-05-22 LAB — CBC WITH DIFFERENTIAL/PLATELET
BASOS PCT: 1 %
Basophils Absolute: 0 10*3/uL (ref 0.0–0.1)
EOS ABS: 0.3 10*3/uL (ref 0.0–0.7)
EOS PCT: 4 %
HCT: 43.7 % (ref 39.0–52.0)
HEMOGLOBIN: 14.8 g/dL (ref 13.0–17.0)
Lymphocytes Relative: 33 %
Lymphs Abs: 2.1 10*3/uL (ref 0.7–4.0)
MCH: 30.1 pg (ref 26.0–34.0)
MCHC: 33.9 g/dL (ref 30.0–36.0)
MCV: 88.8 fL (ref 78.0–100.0)
Monocytes Absolute: 0.7 10*3/uL (ref 0.1–1.0)
Monocytes Relative: 11 %
NEUTROS PCT: 51 %
Neutro Abs: 3.4 10*3/uL (ref 1.7–7.7)
PLATELETS: 166 10*3/uL (ref 150–400)
RBC: 4.92 MIL/uL (ref 4.22–5.81)
RDW: 13.5 % (ref 11.5–15.5)
WBC: 6.5 10*3/uL (ref 4.0–10.5)

## 2017-05-24 ENCOUNTER — Ambulatory Visit (HOSPITAL_COMMUNITY): Payer: Medicare Other | Admitting: Anesthesiology

## 2017-05-24 ENCOUNTER — Encounter (HOSPITAL_COMMUNITY)
Admission: RE | Disposition: A | Discharge status: Home / Self Care | Payer: Self-pay | Source: Ambulatory Visit | Attending: General Surgery

## 2017-05-24 ENCOUNTER — Ambulatory Visit (HOSPITAL_COMMUNITY)
Admission: RE | Admit: 2017-05-24 | Discharge: 2017-05-24 | Disposition: A | Discharge status: Home / Self Care | Payer: Medicare Other | Source: Ambulatory Visit | Attending: General Surgery | Admitting: General Surgery

## 2017-05-24 ENCOUNTER — Encounter (HOSPITAL_COMMUNITY): Payer: Self-pay | Admitting: Anesthesiology

## 2017-05-24 DIAGNOSIS — Z7982 Long term (current) use of aspirin: Secondary | ICD-10-CM | POA: Insufficient documentation

## 2017-05-24 DIAGNOSIS — Z87891 Personal history of nicotine dependence: Secondary | ICD-10-CM | POA: Insufficient documentation

## 2017-05-24 DIAGNOSIS — K4090 Unilateral inguinal hernia, without obstruction or gangrene, not specified as recurrent: Secondary | ICD-10-CM | POA: Diagnosis not present

## 2017-05-24 DIAGNOSIS — Z86718 Personal history of other venous thrombosis and embolism: Secondary | ICD-10-CM | POA: Insufficient documentation

## 2017-05-24 HISTORY — PX: INGUINAL HERNIA REPAIR: SHX194

## 2017-05-24 SURGERY — REPAIR, HERNIA, INGUINAL, ADULT
Anesthesia: General | Site: Groin | Laterality: Left

## 2017-05-24 MED ORDER — BUPIVACAINE LIPOSOME 1.3 % IJ SUSP
INTRAMUSCULAR | Status: AC
  Filled 2017-05-24: qty 20

## 2017-05-24 MED ORDER — LIDOCAINE HCL (CARDIAC) 10 MG/ML IV SOLN
INTRAVENOUS | Status: DC | PRN
  Administered 2017-05-24: 50 mg via INTRAVENOUS

## 2017-05-24 MED ORDER — KETOROLAC TROMETHAMINE 30 MG/ML IJ SOLN
INTRAMUSCULAR | Status: AC
  Filled 2017-05-24: qty 1

## 2017-05-24 MED ORDER — CHLORHEXIDINE GLUCONATE CLOTH 2 % EX PADS: 6.0000 | MEDICATED_PAD | Freq: Once | CUTANEOUS | Status: DC

## 2017-05-24 MED ORDER — HYDROCODONE-ACETAMINOPHEN 5-325 MG PO TABS: 1.0000 | ORAL_TABLET | Freq: Four times a day (QID) | ORAL | 0 refills | Status: DC | PRN

## 2017-05-24 MED ORDER — LACTATED RINGERS IV SOLN
INTRAVENOUS | Status: DC
  Administered 2017-05-24: 07:00:00 via INTRAVENOUS

## 2017-05-24 MED ORDER — FENTANYL CITRATE (PF) 100 MCG/2ML IJ SOLN
25.0000 ug | INTRAMUSCULAR | Status: DC | PRN
  Administered 2017-05-24: 50 ug via INTRAVENOUS
  Filled 2017-05-24: qty 2

## 2017-05-24 MED ORDER — MIDAZOLAM HCL 2 MG/2ML IJ SOLN
INTRAMUSCULAR | Status: AC
  Filled 2017-05-24: qty 2

## 2017-05-24 MED ORDER — BUPIVACAINE LIPOSOME 1.3 % IJ SUSP
INTRAMUSCULAR | Status: DC | PRN
  Administered 2017-05-24: 20 mL

## 2017-05-24 MED ORDER — PROPOFOL 10 MG/ML IV BOLUS
INTRAVENOUS | Status: DC | PRN
  Administered 2017-05-24: 180 mg via INTRAVENOUS

## 2017-05-24 MED ORDER — FENTANYL CITRATE (PF) 100 MCG/2ML IJ SOLN
25.0000 ug | Freq: Once | INTRAMUSCULAR | Status: AC
  Administered 2017-05-24: 25 ug via INTRAVENOUS

## 2017-05-24 MED ORDER — PROPOFOL 10 MG/ML IV BOLUS
INTRAVENOUS | Status: AC
  Filled 2017-05-24: qty 40

## 2017-05-24 MED ORDER — FENTANYL CITRATE (PF) 100 MCG/2ML IJ SOLN
INTRAMUSCULAR | Status: AC
  Filled 2017-05-24: qty 2

## 2017-05-24 MED ORDER — FENTANYL CITRATE (PF) 100 MCG/2ML IJ SOLN
INTRAMUSCULAR | Status: DC | PRN
  Administered 2017-05-24: 25 ug via INTRAVENOUS
  Administered 2017-05-24 (×4): 50 ug via INTRAVENOUS
  Administered 2017-05-24: 25 ug via INTRAVENOUS

## 2017-05-24 MED ORDER — MIDAZOLAM HCL 2 MG/2ML IJ SOLN
1.0000 mg | INTRAMUSCULAR | Status: AC
  Administered 2017-05-24: 2 mg via INTRAVENOUS

## 2017-05-24 MED ORDER — SODIUM CHLORIDE 0.9 % IR SOLN
Status: DC | PRN
  Administered 2017-05-24: 1000 mL

## 2017-05-24 MED ORDER — KETOROLAC TROMETHAMINE 30 MG/ML IJ SOLN
30.0000 mg | Freq: Once | INTRAMUSCULAR | Status: AC
  Administered 2017-05-24: 30 mg via INTRAVENOUS

## 2017-05-24 MED ORDER — FENTANYL CITRATE (PF) 250 MCG/5ML IJ SOLN
INTRAMUSCULAR | Status: AC
  Filled 2017-05-24: qty 5

## 2017-05-24 MED ORDER — SUCCINYLCHOLINE CHLORIDE 20 MG/ML IJ SOLN
INTRAMUSCULAR | Status: AC
  Filled 2017-05-24: qty 2

## 2017-05-24 MED ORDER — CEFAZOLIN SODIUM-DEXTROSE 2-4 GM/100ML-% IV SOLN
2.0000 g | INTRAVENOUS | Status: AC
  Administered 2017-05-24: 2 g via INTRAVENOUS
  Filled 2017-05-24: qty 100

## 2017-05-24 MED ORDER — LIDOCAINE HCL (PF) 1 % IJ SOLN
INTRAMUSCULAR | Status: AC
  Filled 2017-05-24: qty 5

## 2017-05-24 SURGICAL SUPPLY — 37 items
BAG HAMPER (MISCELLANEOUS) ×2 IMPLANT
CLOTH BEACON ORANGE TIMEOUT ST (SAFETY) ×2 IMPLANT
COVER LIGHT HANDLE STERIS (MISCELLANEOUS) ×4 IMPLANT
DERMABOND ADVANCED (GAUZE/BANDAGES/DRESSINGS) ×1
DERMABOND ADVANCED .7 DNX12 (GAUZE/BANDAGES/DRESSINGS) ×1 IMPLANT
DRAIN PENROSE 18X1/2 LTX STRL (DRAIN) ×2 IMPLANT
ELECT REM PT RETURN 9FT ADLT (ELECTROSURGICAL) ×2
ELECTRODE REM PT RTRN 9FT ADLT (ELECTROSURGICAL) ×1 IMPLANT
GLOVE BIOGEL PI IND STRL 6.5 (GLOVE) ×1 IMPLANT
GLOVE BIOGEL PI IND STRL 7.0 (GLOVE) ×3 IMPLANT
GLOVE BIOGEL PI INDICATOR 6.5 (GLOVE) ×1
GLOVE BIOGEL PI INDICATOR 7.0 (GLOVE) ×3
GLOVE SURG SS PI 7.5 STRL IVOR (GLOVE) ×2 IMPLANT
GOWN STRL REUS W/ TWL XL LVL3 (GOWN DISPOSABLE) ×1 IMPLANT
GOWN STRL REUS W/TWL LRG LVL3 (GOWN DISPOSABLE) ×4 IMPLANT
GOWN STRL REUS W/TWL XL LVL3 (GOWN DISPOSABLE) ×2
INST SET MINOR GENERAL (KITS) ×2 IMPLANT
KIT ROOM TURNOVER APOR (KITS) ×2 IMPLANT
MANIFOLD NEPTUNE II (INSTRUMENTS) ×2 IMPLANT
MESH HERNIA 1.6X1.9 PLUG LRG (Mesh General) ×1 IMPLANT
MESH HERNIA PLUG LRG (Mesh General) ×1 IMPLANT
NEEDLE HYPO 21X1.5 SAFETY (NEEDLE) ×2 IMPLANT
NS IRRIG 1000ML POUR BTL (IV SOLUTION) ×2 IMPLANT
PACK MINOR (CUSTOM PROCEDURE TRAY) ×2 IMPLANT
PAD ARMBOARD 7.5X6 YLW CONV (MISCELLANEOUS) ×2 IMPLANT
SET BASIN LINEN APH (SET/KITS/TRAYS/PACK) ×2 IMPLANT
SUT NOVA NAB GS-22 2 2-0 T-19 (SUTURE) ×6 IMPLANT
SUT PROLENE 2 0 SH 30 (SUTURE) IMPLANT
SUT SILK 3 0 (SUTURE)
SUT SILK 3-0 18XBRD TIE 12 (SUTURE) IMPLANT
SUT VIC AB 2-0 CT1 27 (SUTURE) ×1
SUT VIC AB 2-0 CT1 TAPERPNT 27 (SUTURE) ×1 IMPLANT
SUT VIC AB 3-0 SH 27 (SUTURE) ×1
SUT VIC AB 3-0 SH 27X BRD (SUTURE) ×1 IMPLANT
SUT VIC AB 4-0 PS2 27 (SUTURE) ×2 IMPLANT
SUT VICRYL AB 3 0 TIES (SUTURE) IMPLANT
SYR 20CC LL (SYRINGE) ×2 IMPLANT

## 2017-05-24 NOTE — Anesthesia Postprocedure Evaluation (Signed)
Anesthesia Post Note  Patient: Andrew Newton  Procedure(s) Performed: Procedure(s) (LRB): LEFT INGUINAL HERNIORRHAPHY WITH MESH (Left)  Patient location during evaluation: Short Stay Anesthesia Type: General Level of consciousness: awake and alert Pain management: satisfactory to patient Vital Signs Assessment: post-procedure vital signs reviewed and stable Respiratory status: spontaneous breathing Cardiovascular status: stable Postop Assessment: no signs of nausea or vomiting Anesthetic complications: no     Last Vitals:  Vitals:   05/24/17 0942 05/24/17 0958  BP: (!) 142/91 (!) 142/91  Pulse: 63 70  Resp: 11 12  Temp:  36.6 C    Last Pain:  Vitals:   05/24/17 0958  TempSrc: Oral  PainSc: 4                  Darsi Tien

## 2017-05-24 NOTE — Anesthesia Procedure Notes (Signed)
Procedure Name: LMA Insertion Date/Time: 05/24/2017 7:38 AM Performed by: Tressie Stalker E Pre-anesthesia Checklist: Patient identified, Patient being monitored, Emergency Drugs available, Timeout performed and Suction available Patient Re-evaluated:Patient Re-evaluated prior to induction Oxygen Delivery Method: Circle System Utilized Preoxygenation: Pre-oxygenation with 100% oxygen Induction Type: IV induction Ventilation: Mask ventilation without difficulty LMA: LMA inserted LMA Size: 5.0 Number of attempts: 1 Placement Confirmation: positive ETCO2 and breath sounds checked- equal and bilateral

## 2017-05-24 NOTE — Interval H&P Note (Signed)
History and Physical Interval Note:  05/24/2017 7:15 AM  Andrew Newton  has presented today for surgery, with the diagnosis of left inguinal hernia  The various methods of treatment have been discussed with the patient and family. After consideration of risks, benefits and other options for treatment, the patient has consented to  Procedure(s): HERNIA REPAIR INGUINAL ADULT WITH MESH (Left) as a surgical intervention .  The patient's history has been reviewed, patient examined, no change in status, stable for surgery.  I have reviewed the patient's chart and labs.  Questions were answered to the patient's satisfaction.     Aviva Signs

## 2017-05-24 NOTE — Anesthesia Preprocedure Evaluation (Signed)
Anesthesia Evaluation  Patient identified by MRN, date of birth, ID band Patient awake    Reviewed: Allergy & Precautions, NPO status , Patient's Chart, lab work & pertinent test results  Airway Mallampati: II  TM Distance: >3 FB     Dental  (+) Edentulous Upper, Partial Lower   Pulmonary neg pulmonary ROS, former smoker,    breath sounds clear to auscultation       Cardiovascular + DVT (s/p thrombectomy)   Rhythm:Regular Rate:Normal     Neuro/Psych    GI/Hepatic negative GI ROS, Neg liver ROS,   Endo/Other    Renal/GU negative Renal ROS     Musculoskeletal   Abdominal   Peds  Hematology negative hematology ROS (+)   Anesthesia Other Findings   Reproductive/Obstetrics                             Anesthesia Physical Anesthesia Plan  ASA: II  Anesthesia Plan: General   Post-op Pain Management:    Induction: Intravenous  PONV Risk Score and Plan:   Airway Management Planned: LMA  Additional Equipment:   Intra-op Plan:   Post-operative Plan: Extubation in OR  Informed Consent: I have reviewed the patients History and Physical, chart, labs and discussed the procedure including the risks, benefits and alternatives for the proposed anesthesia with the patient or authorized representative who has indicated his/her understanding and acceptance.     Plan Discussed with:   Anesthesia Plan Comments:         Anesthesia Quick Evaluation

## 2017-05-24 NOTE — Transfer of Care (Signed)
Immediate Anesthesia Transfer of Care Note  Patient: Andrew Newton  Procedure(s) Performed: Procedure(s): LEFT INGUINAL HERNIORRHAPHY WITH MESH (Left)  Patient Location: PACU  Anesthesia Type:General  Level of Consciousness: awake  Airway & Oxygen Therapy: Patient Spontanous Breathing and Patient connected to face mask oxygen  Post-op Assessment: Report given to RN  Post vital signs: Reviewed and stable  Last Vitals:  Vitals:   05/24/17 0715 05/24/17 0730  BP: (!) 142/83   Pulse:    Resp: (!) 27 (!) 22  Temp:      Last Pain:  Vitals:   05/24/17 0638  TempSrc: Oral      Patients Stated Pain Goal: 9 (42/76/70 1100)  Complications: No apparent anesthesia complications

## 2017-05-24 NOTE — Discharge Instructions (Signed)
Open Hernia Repair, Adult, Care After °This sheet gives you information about how to care for yourself after your procedure. Your health care provider may also give you more specific instructions. If you have problems or questions, contact your health care provider. °What can I expect after the procedure? °After the procedure, it is common to have: °· Mild discomfort. °· Slight bruising. °· Minor swelling. °· Pain in the abdomen. ° °Follow these instructions at home: °Incision care ° °· Follow instructions from your health care provider about how to take care of your incision area. Make sure you: °? Wash your hands with soap and water before you change your bandage (dressing). If soap and water are not available, use hand sanitizer. °? Change your dressing as told by your health care provider. °? Leave stitches (sutures), skin glue, or adhesive strips in place. These skin closures may need to stay in place for 2 weeks or longer. If adhesive strip edges start to loosen and curl up, you may trim the loose edges. Do not remove adhesive strips completely unless your health care provider tells you to do that. °· Check your incision area every day for signs of infection. Check for: °? More redness, swelling, or pain. °? More fluid or blood. °? Warmth. °? Pus or a bad smell. °Activity °· Do not drive or use heavy machinery while taking prescription pain medicine. Do not drive until your health care provider approves. °· Until your health care provider approves: °? Do not lift anything that is heavier than 10 lb (4.5 kg). °? Do not play contact sports. °· Return to your normal activities as told by your health care provider. Ask your health care provider what activities are safe. °General instructions °· To prevent or treat constipation while you are taking prescription pain medicine, your health care provider may recommend that you: °? Drink enough fluid to keep your urine clear or pale yellow. °? Take over-the-counter or  prescription medicines. °? Eat foods that are high in fiber, such as fresh fruits and vegetables, whole grains, and beans. °? Limit foods that are high in fat and processed sugars, such as fried and sweet foods. °· Take over-the-counter and prescription medicines only as told by your health care provider. °· Do not take tub baths or go swimming until your health care provider approves. °· Keep all follow-up visits as told by your health care provider. This is important. °Contact a health care provider if: °· You develop a rash. °· You have more redness, swelling, or pain around your incision. °· You have more fluid or blood coming from your incision. °· Your incision feels warm to the touch. °· You have pus or a bad smell coming from your incision. °· You have a fever or chills. °· You have blood in your stool (feces). °· You have not had a bowel movement in 2-3 days. °· Your pain is not controlled with medicine. °Get help right away if: °· You have chest pain or shortness of breath. °· You feel light-headed or feel faint. °· You have severe pain. °· You vomit and your pain is worse. °This information is not intended to replace advice given to you by your health care provider. Make sure you discuss any questions you have with your health care provider. °Document Released: 05/04/2005 Document Revised: 05/04/2016 Document Reviewed: 03/28/2016 °Elsevier Interactive Patient Education © 2017 [employer] ° °

## 2017-05-24 NOTE — Op Note (Signed)
Patient:  Andrew Newton  DOB:  April 17, 1946  MRN:  [id number]   Preop Diagnosis:  Left inguinal hernia  Postop Diagnosis:  Same  Procedure:  Left inguinal herniorrhaphy with mesh  Surgeon:  Aviva Signs, M.D.  Anes:  Gen.   Indications:  Patient is a 71 year old white male who presents with a symptomatic left inguinal hernia. The risks and benefits of the procedure including bleeding, infection, mesh use, and the possibility of recurrence of the hernia were fully explained to the patient, who gave informed consent.  Procedure note:  The patient was placed the supine position. After general anesthesia was administered, the left groin region was prepped and draped using the usual sterile technique with Techni-Care. Surgical site confirmation was performed.  A left groin incision was made down to the external oblique aponeuroses. The aponeuroses was incised to the external ring. A Penrose drain was placed around spermatic cord. The vase deference was no within the spermatic cord. There was a significant amount of adipose tissue along the spermatic cord. The patient had a large indirect hernia sac. This was freed away from the spermatic cord up to the peritoneal reflection and inverted. A large Bard PerFix plug was then inserted. An onlay patch was then placed along the floor of the inguinal canal and secured superiorly to the conjoined tendon and inferiorly to the shelving edge Poupart's ligament using 2-0 Novafil interrupted sutures. The internal ring was re-created using a 2-0 Novafil interrupted suture. The external oblique aponeuroses was reapproximated using a 2-0 Vicryl running suture. Care was taken to avoid the ilioinguinal nerve throughout the procedure. The subcutaneous layer was reapproximated using a 3-0 Vicryl interrupted suture. Exparel was instilled into the surrounding wound. The skin was closed using a 4-0 Vicryl subcuticular suture. Dermabond was applied.  All tape and needle  counts were correct at the end of the procedure. Patient was awakened and transferred to PACU in stable condition.    Complications:  None  EBL:  Minimal  Specimen:  None

## 2017-05-27 ENCOUNTER — Encounter (HOSPITAL_COMMUNITY): Payer: Self-pay | Admitting: General Surgery

## 2017-05-29 ENCOUNTER — Encounter (HOSPITAL_COMMUNITY): Payer: Self-pay | Admitting: General Surgery

## 2017-05-30 ENCOUNTER — Encounter: Payer: Self-pay | Admitting: General Surgery

## 2017-05-30 ENCOUNTER — Ambulatory Visit (INDEPENDENT_AMBULATORY_CARE_PROVIDER_SITE_OTHER): Payer: Self-pay | Admitting: General Surgery

## 2017-05-30 VITALS — BP 149/81 | HR 69 | Temp 97.3°F | Resp 18 | Ht 72.0 in | Wt 252.0 lb

## 2017-05-30 DIAGNOSIS — Z09 Encounter for follow-up examination after completed treatment for conditions other than malignant neoplasm: Secondary | ICD-10-CM

## 2017-05-30 NOTE — Progress Notes (Signed)
Subjective:     Andrew Newton  Status post left inguinal herniorrhaphy with mesh. Doing well. Minimal pain is well controlled with ibuprofen. Objective:    BP (!) 149/81   Pulse 69   Temp (!) 97.3 F (36.3 C)   Resp 18   Ht 6' (1.829 m)   Wt 252 lb (114.3 kg)   BMI 34.18 kg/m   Left inguinal incision healing well.    Assessment:    Doing well postoperatively.    Plan:   Increase activity as able. Follow-up here as needed.

## 2017-06-08 IMAGING — CR DG BE W/ AIR HIGH DENSITY
5 series · 5 of 5 positions shown · non-contrast
Comparison: None

CLINICAL DATA: Incomplete screening colonoscopy

EXAM:
AIR CONTRAST BARIUM ENEMA
TECHNIQUE: Initial scout AP supine abdominal image obtained to insure adequate
colon cleansing. Barium was introduced into the colon in a
retrograde fashion and refluxed from the rectum to the distal
transverse colon. As much of the barium as possible was then removed
through the indwelling tube via gravity drain. Air was then
insufflated into the colon. Spot images of the colon followed by
overhead radiographs were obtained.
FLUOROSCOPY TIME:  Radiation Exposure Index (as provided by the
fluoroscopic device): Not provided
If the device does not provide the exposure index:
Fluoroscopy Time:  4 minutes 12 seconds
Number of Acquired Images: 5 plus multiple screen captures during
fluoroscopy

[view not recorded (1 of 5)]
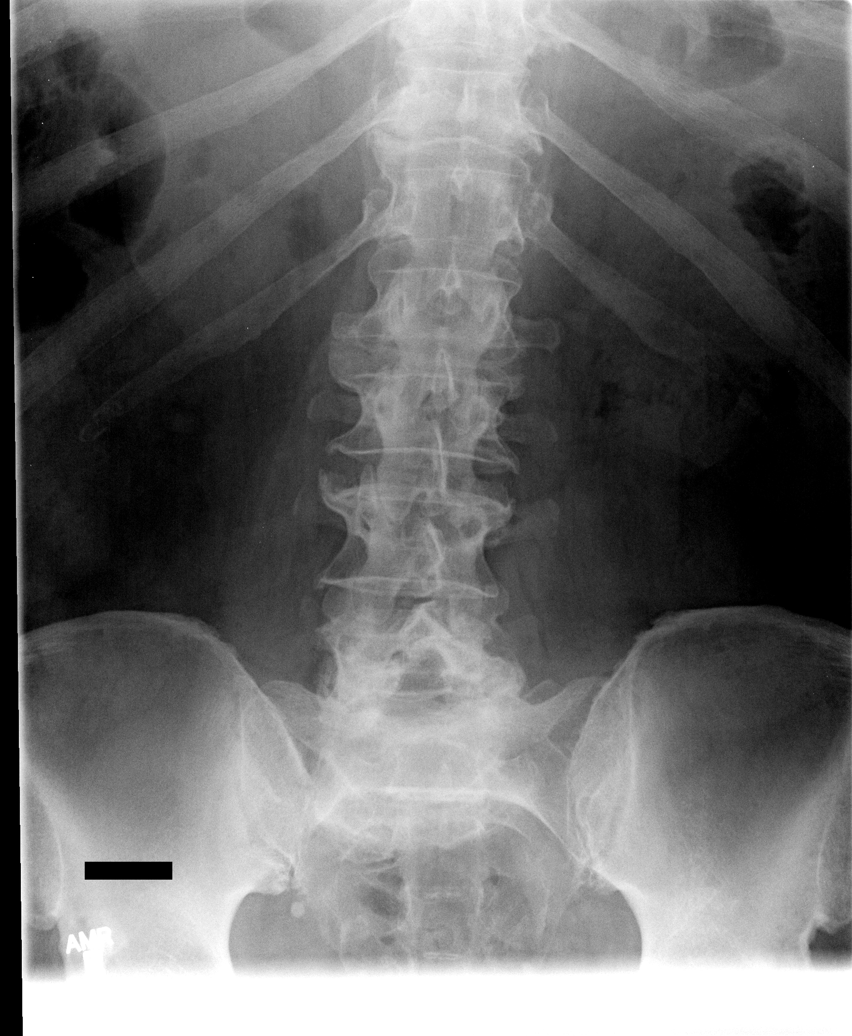

[view not recorded (2 of 5)]
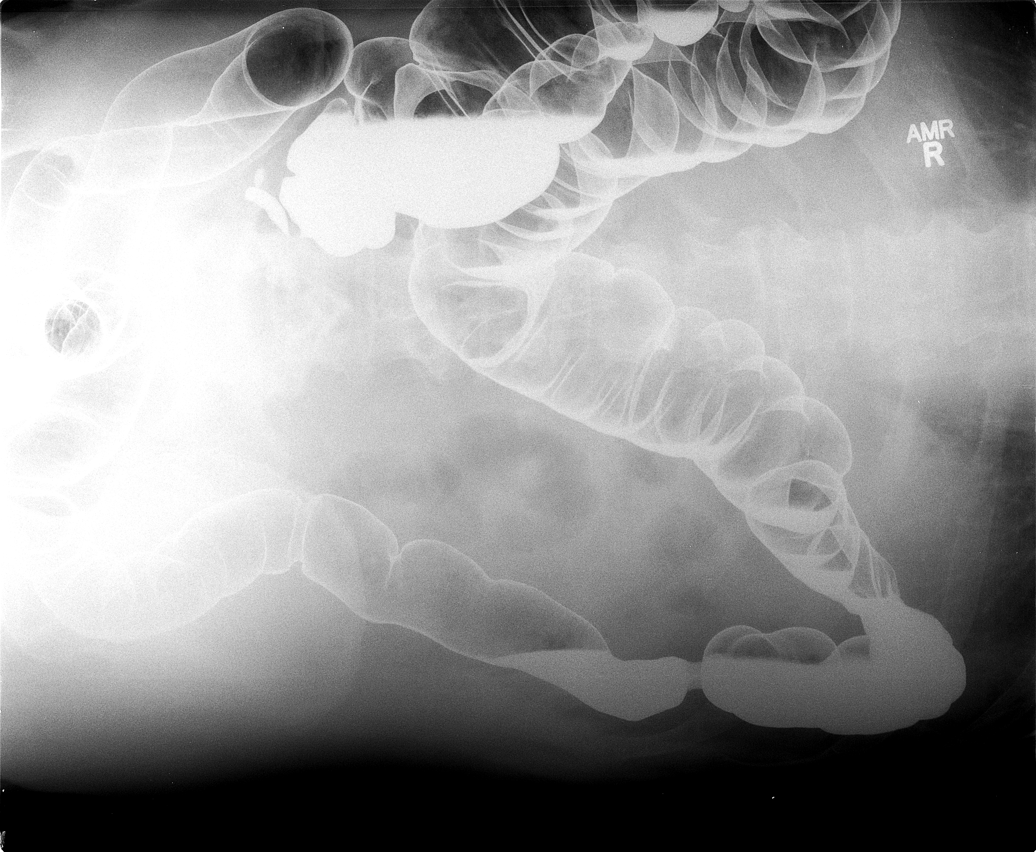

[view not recorded (3 of 5)]
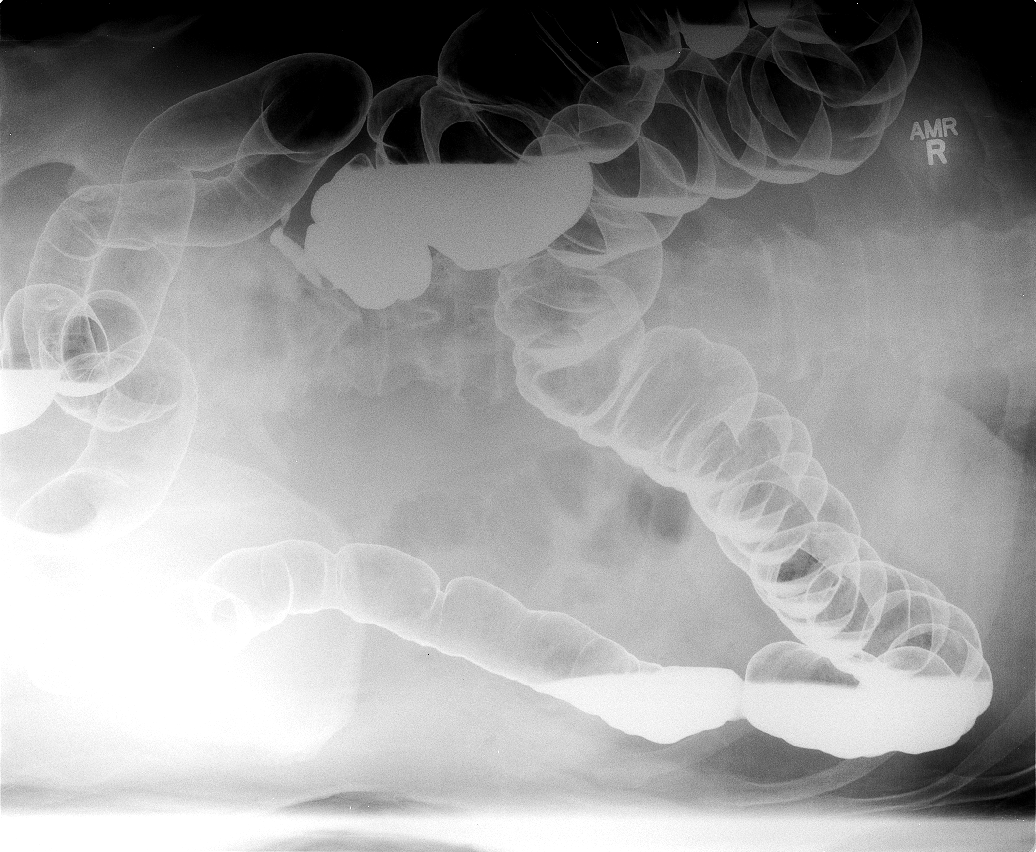

[view not recorded (4 of 5)]
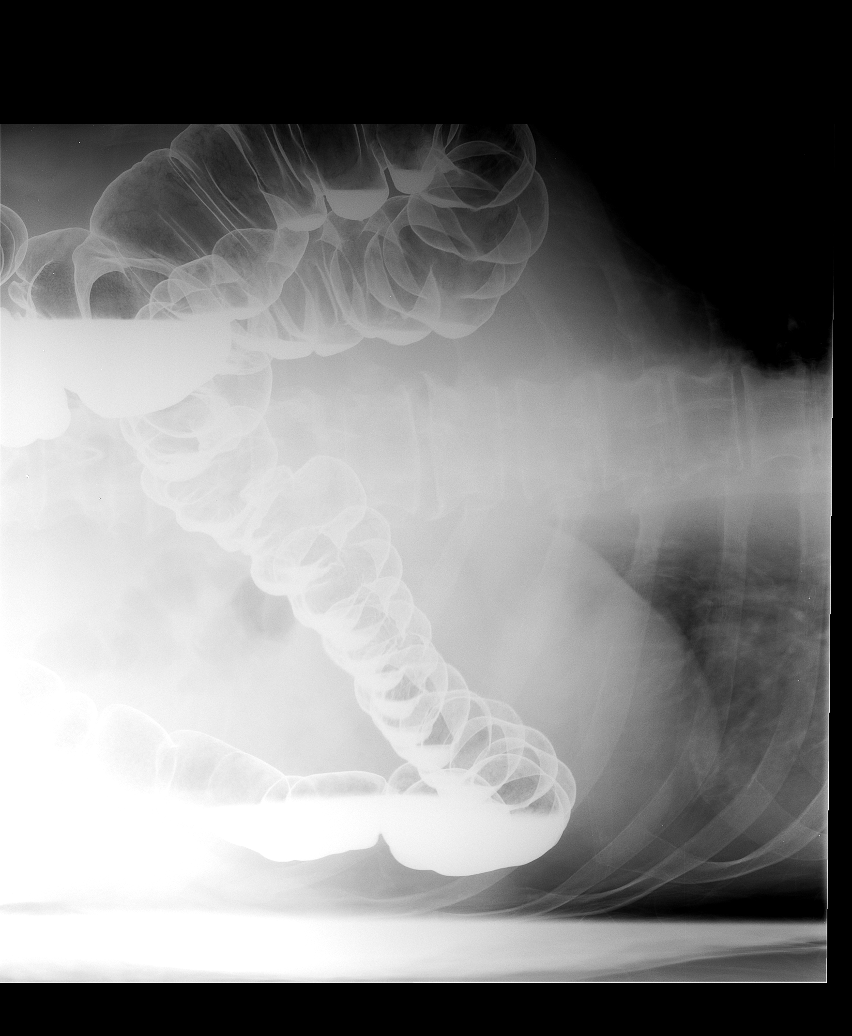

[view not recorded (5 of 5)]
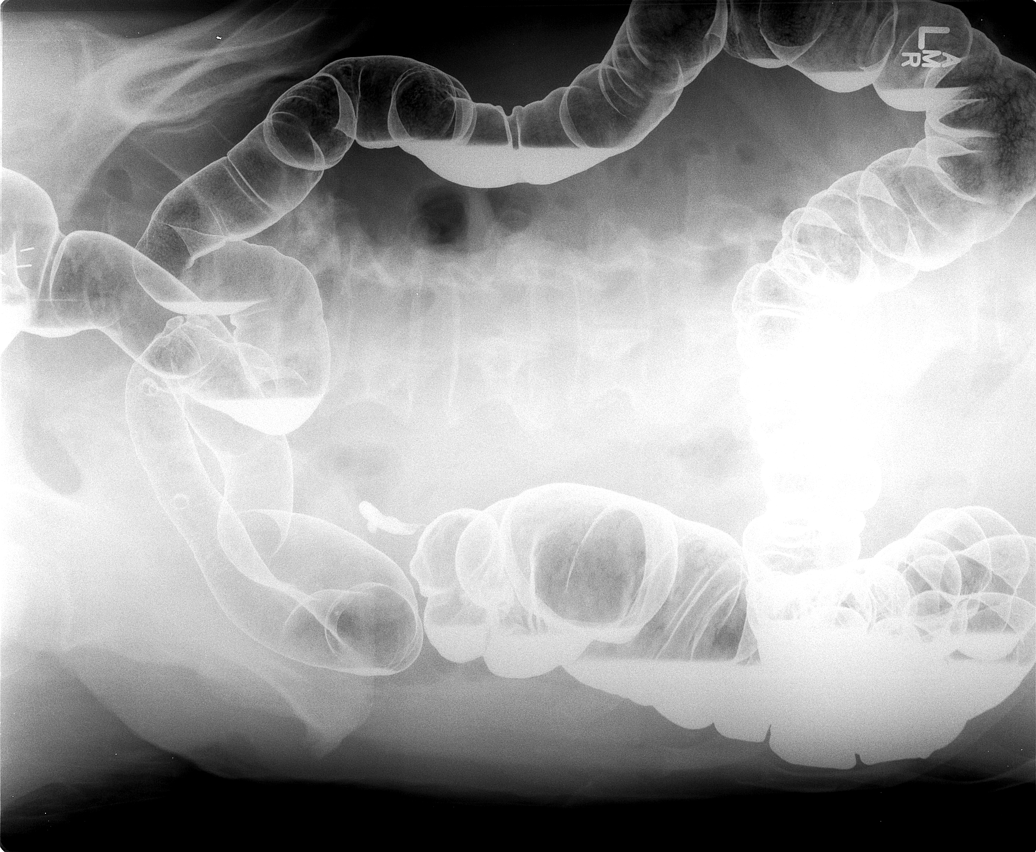

[5 of 5 positions shown; findings below may reference images not displayed]

FINDINGS: Normal bowel gas pattern on scout image.

Degenerative disc disease changes lumbar spine.

Excellent colon prep.

Normal colonic distention throughout its length.

Easy retrograde passage of contrast from rectum to cecal tip with
visualization of appendix.

Competent ileocecal valve.

Smooth appearance of colonic mucosa without irregularity or
ulceration.

Several small uncomplicated diverticula noted at a redundant sigmoid
colon.

No mass, stricture or persistent intraluminal filling defect.

Specifically, the cecum and ascending colon are normal in
appearance.
IMPRESSION: Redundant sigmoid colon with a few uncomplicated colonic
diverticula.

Otherwise negative exam.

## 2017-08-27 DIAGNOSIS — R7301 Impaired fasting glucose: Secondary | ICD-10-CM | POA: Diagnosis not present

## 2017-08-27 DIAGNOSIS — Z125 Encounter for screening for malignant neoplasm of prostate: Secondary | ICD-10-CM | POA: Diagnosis not present

## 2017-08-28 DIAGNOSIS — Z23 Encounter for immunization: Secondary | ICD-10-CM | POA: Diagnosis not present

## 2017-09-02 DIAGNOSIS — Z6834 Body mass index (BMI) 34.0-34.9, adult: Secondary | ICD-10-CM | POA: Diagnosis not present

## 2017-09-02 DIAGNOSIS — R7301 Impaired fasting glucose: Secondary | ICD-10-CM | POA: Diagnosis not present

## 2017-12-02 DIAGNOSIS — R05 Cough: Secondary | ICD-10-CM | POA: Diagnosis not present

## 2017-12-02 DIAGNOSIS — J209 Acute bronchitis, unspecified: Secondary | ICD-10-CM | POA: Diagnosis not present

## 2018-07-17 DIAGNOSIS — I8000 Phlebitis and thrombophlebitis of superficial vessels of unspecified lower extremity: Secondary | ICD-10-CM | POA: Diagnosis not present

## 2018-08-05 DIAGNOSIS — Z23 Encounter for immunization: Secondary | ICD-10-CM | POA: Diagnosis not present

## 2018-08-19 DIAGNOSIS — H524 Presbyopia: Secondary | ICD-10-CM | POA: Diagnosis not present

## 2018-08-19 DIAGNOSIS — H43392 Other vitreous opacities, left eye: Secondary | ICD-10-CM | POA: Diagnosis not present

## 2018-08-19 DIAGNOSIS — H2513 Age-related nuclear cataract, bilateral: Secondary | ICD-10-CM | POA: Diagnosis not present

## 2019-08-26 DIAGNOSIS — Z23 Encounter for immunization: Secondary | ICD-10-CM | POA: Diagnosis not present

## 2019-09-07 DIAGNOSIS — R7301 Impaired fasting glucose: Secondary | ICD-10-CM | POA: Diagnosis not present

## 2019-09-09 DIAGNOSIS — Z125 Encounter for screening for malignant neoplasm of prostate: Secondary | ICD-10-CM | POA: Diagnosis not present

## 2019-09-09 DIAGNOSIS — R7301 Impaired fasting glucose: Secondary | ICD-10-CM | POA: Diagnosis not present

## 2020-01-11 DIAGNOSIS — Z23 Encounter for immunization: Secondary | ICD-10-CM | POA: Diagnosis not present

## 2020-02-08 DIAGNOSIS — Z23 Encounter for immunization: Secondary | ICD-10-CM | POA: Diagnosis not present

## 2020-08-24 DIAGNOSIS — Z23 Encounter for immunization: Secondary | ICD-10-CM | POA: Diagnosis not present

## 2020-09-27 DIAGNOSIS — E119 Type 2 diabetes mellitus without complications: Secondary | ICD-10-CM | POA: Diagnosis not present

## 2020-09-27 DIAGNOSIS — R7301 Impaired fasting glucose: Secondary | ICD-10-CM | POA: Diagnosis not present

## 2020-09-27 DIAGNOSIS — Z125 Encounter for screening for malignant neoplasm of prostate: Secondary | ICD-10-CM | POA: Diagnosis not present

## 2020-10-04 DIAGNOSIS — R7309 Other abnormal glucose: Secondary | ICD-10-CM | POA: Diagnosis not present

## 2020-10-04 DIAGNOSIS — R7303 Prediabetes: Secondary | ICD-10-CM | POA: Diagnosis not present

## 2020-10-04 DIAGNOSIS — E785 Hyperlipidemia, unspecified: Secondary | ICD-10-CM | POA: Diagnosis not present

## 2020-10-14 NOTE — Telephone Encounter (Signed)
 10/14/20  Patient is calling because they: are interested in infusion therapy.   Patient is experiencing symptoms severe enough that they would like to speak with a nurse? No  Patient is interested in infusion therapy. Advised I can send your request to our therapy coordinators who will call you back to determine your eligibility and discuss any questions you may have. Our therapy coordinators are working hard to process requests and contact as many patients as they can each day. You may receive a call back shortly or in a day or two. To avoid delays: Please answer calls from unfamiliar numbers, ensure your phone is set up to accept/receive voicemails and call back if you haven't received a call in 48 hours. Submitted order for [employer].  The call was resolved in the following manner: General information given   Informaton on [email or web address] [phone] due to day #9 since symptoms

## 2020-10-20 ENCOUNTER — Ambulatory Visit: Payer: Self-pay | Admitting: *Deleted

## 2020-10-20 DIAGNOSIS — J1282 Pneumonia due to coronavirus disease 2019: Secondary | ICD-10-CM | POA: Diagnosis not present

## 2020-10-20 DIAGNOSIS — R0602 Shortness of breath: Secondary | ICD-10-CM | POA: Diagnosis not present

## 2020-10-20 DIAGNOSIS — R5382 Chronic fatigue, unspecified: Secondary | ICD-10-CM | POA: Diagnosis not present

## 2020-10-20 DIAGNOSIS — U071 COVID-19: Secondary | ICD-10-CM | POA: Diagnosis not present

## 2020-10-20 NOTE — Telephone Encounter (Signed)
Pt's wife Andrew Newton called in regarding her husband on the line for covid long haulers that she found while researching what to do.  Her husband is covid positive.   This is day 15 today.  She is concerned because he is not getting better.  He is very fatigued, "hiccuping" or catching his breath while he is sleeping laying. Down.  The fever and diarrhea have subsided but he is not getting better. His brother passed away with COVID and several family members have had it and have not done well with it. I've called his doctor Dr. Asencion Noble here in [place].   I've not been able to talk with him.   I've talked with his nurse but that's it.   "I'm frustrated and worried about my husband and don't know what to do".   "I can't get a response from his doctor".   "He won't talk with me or call me back".  I let her know she has the option of taking him to an urgent care or ED if she feels he is not getting better and not getting a response from his PCP.   "The office opens this morning at 8:30 so I'm going to try calling them one more time".   "If no response then I'm going to take him to [facility] here in [place].  I told her that was a great idea.  I went over the s/s of shortness of breath, chest pain, his condition becoming worse to take him on to the ED.   "I will certainly do that".  "I may just take him to the hospital anyway".    Reason for Disposition . [1] HIGH RISK patient AND [2] influenza is widespread in the community AND [3] ONE OR MORE respiratory symptoms: cough, sore throat, runny or stuffy nose  Answer Assessment - Initial Assessment Questions 1. COVID-19 DIAGNOSIS: "Who made your COVID-19 diagnosis?" "Was it confirmed by a positive lab test?" If not diagnosed by a HCP, ask "Are there lots of cases (community spread) where you live?" Note: See public health department website, if unsure.     Wife Andrew Newton calling in.   Husband got symptoms of Covid.  Positive.  He has a  headache, dizzy and fatigue.   Not getting better.   I would check on him during the night and he would hiccup when he breaths. He is also having diarrhea.   They have been in contact with their PCP but haven't talked with him only the nurse.   He is coughing and is losing his breath.  It's worse when he is laying down.   He is staying in the bed most of the time.   No fever now. Over last 2 days diarrhea has stopped and he is eating better and drinking. No underlying heart or lung illnesses.   Not diabetic. 2. COVID-19 EXPOSURE: "Was there any known exposure to COVID before the symptoms began?" CDC Definition of close contact: within 6 feet (2 meters) for a total of 15 minutes or more over a 24-hour period.      *No Answer* 3. ONSET: "When did the COVID-19 symptoms start?"      15 days ago.   4. WORST SYMPTOM: "What is your worst symptom?" (e.g., cough, fever, shortness of breath, muscle aches)     Extreme fatigue 5. COUGH: "Do you have a cough?" If Yes, ask: "How bad is the cough?"       yes 6. FEVER: "  Do you have a fever?" If Yes, ask: "What is your temperature, how was it measured, and when did it start?"     Not now but has been febrile 7. RESPIRATORY STATUS: "Describe your breathing?" (e.g., shortness of breath, wheezing, unable to speak)      When laying down he "hiccups" or catches his breath while breathing. 8. BETTER-SAME-WORSE: "Are you getting better, staying the same or getting worse compared to yesterday?"  If getting worse, ask, "In what way?"     Not getting better and it's day 15. 9. HIGH RISK DISEASE: "Do you have any chronic medical problems?" (e.g., asthma, heart or lung disease, weak immune system, obesity, etc.)     None per wife 74. VACCINE: "Have you gotten the COVID-19 vaccine?" If Yes ask: "Which one, how many shots, when did you get it?"       Not asked 11. PREGNANCY: "Is there any chance you are pregnant?" "When was your last menstrual period?"       N/A 12.  OTHER SYMPTOMS: "Do you have any other symptoms?"  (e.g., chills, fatigue, headache, loss of smell or taste, muscle pain, sore throat; new loss of smell or taste especially support the diagnosis of COVID-19)       Wife Andrew Newton called in on the covid line for long haulers out of frustration.  Protocols used: CORONAVIRUS (COVID-19) DIAGNOSED OR SUSPECTED-A-AH

## 2021-08-12 DIAGNOSIS — Z23 Encounter for immunization: Secondary | ICD-10-CM | POA: Diagnosis not present

## 2021-10-10 DIAGNOSIS — Z125 Encounter for screening for malignant neoplasm of prostate: Secondary | ICD-10-CM | POA: Diagnosis not present

## 2021-10-10 DIAGNOSIS — E785 Hyperlipidemia, unspecified: Secondary | ICD-10-CM | POA: Diagnosis not present

## 2021-10-10 DIAGNOSIS — R7303 Prediabetes: Secondary | ICD-10-CM | POA: Diagnosis not present

## 2021-10-17 DIAGNOSIS — I493 Ventricular premature depolarization: Secondary | ICD-10-CM | POA: Diagnosis not present

## 2021-10-17 DIAGNOSIS — R7309 Other abnormal glucose: Secondary | ICD-10-CM | POA: Diagnosis not present

## 2021-10-17 DIAGNOSIS — E785 Hyperlipidemia, unspecified: Secondary | ICD-10-CM | POA: Diagnosis not present

## 2021-10-17 DIAGNOSIS — Z6835 Body mass index (BMI) 35.0-35.9, adult: Secondary | ICD-10-CM | POA: Diagnosis not present

## 2022-06-01 DIAGNOSIS — B354 Tinea corporis: Secondary | ICD-10-CM | POA: Diagnosis not present

## 2022-09-02 DIAGNOSIS — Z23 Encounter for immunization: Secondary | ICD-10-CM | POA: Diagnosis not present

## 2022-09-04 DIAGNOSIS — B354 Tinea corporis: Secondary | ICD-10-CM | POA: Diagnosis not present

## 2022-09-04 DIAGNOSIS — H9201 Otalgia, right ear: Secondary | ICD-10-CM | POA: Diagnosis not present

## 2022-11-28 ENCOUNTER — Emergency Department (HOSPITAL_COMMUNITY): Payer: Medicare Other

## 2022-11-28 ENCOUNTER — Other Ambulatory Visit: Payer: Self-pay

## 2022-11-28 ENCOUNTER — Emergency Department (HOSPITAL_COMMUNITY)
Admission: EM | Admit: 2022-11-28 | Discharge: 2022-11-28 | Disposition: A | Discharge status: Home / Self Care | Payer: Medicare Other | Attending: Emergency Medicine | Admitting: Emergency Medicine

## 2022-11-28 ENCOUNTER — Encounter (HOSPITAL_COMMUNITY): Payer: Self-pay

## 2022-11-28 DIAGNOSIS — G4489 Other headache syndrome: Secondary | ICD-10-CM | POA: Diagnosis not present

## 2022-11-28 DIAGNOSIS — R55 Syncope and collapse: Secondary | ICD-10-CM | POA: Insufficient documentation

## 2022-11-28 DIAGNOSIS — Z7982 Long term (current) use of aspirin: Secondary | ICD-10-CM | POA: Diagnosis not present

## 2022-11-28 DIAGNOSIS — R112 Nausea with vomiting, unspecified: Secondary | ICD-10-CM | POA: Diagnosis not present

## 2022-11-28 DIAGNOSIS — B338 Other specified viral diseases: Secondary | ICD-10-CM

## 2022-11-28 DIAGNOSIS — J210 Acute bronchiolitis due to respiratory syncytial virus: Secondary | ICD-10-CM | POA: Insufficient documentation

## 2022-11-28 DIAGNOSIS — Z1152 Encounter for screening for COVID-19: Secondary | ICD-10-CM | POA: Insufficient documentation

## 2022-11-28 DIAGNOSIS — R42 Dizziness and giddiness: Secondary | ICD-10-CM | POA: Diagnosis not present

## 2022-11-28 DIAGNOSIS — R231 Pallor: Secondary | ICD-10-CM | POA: Diagnosis not present

## 2022-11-28 DIAGNOSIS — I10 Essential (primary) hypertension: Secondary | ICD-10-CM | POA: Diagnosis not present

## 2022-11-28 LAB — CBC WITH DIFFERENTIAL/PLATELET
Abs Immature Granulocytes: 0.04 10*3/uL (ref 0.00–0.07)
Basophils Absolute: 0 10*3/uL (ref 0.0–0.1)
Basophils Relative: 1 %
Eosinophils Absolute: 0.3 10*3/uL (ref 0.0–0.5)
Eosinophils Relative: 3 %
HCT: 38.9 % — ABNORMAL LOW (ref 39.0–52.0)
Hemoglobin: 12.6 g/dL — ABNORMAL LOW (ref 13.0–17.0)
Immature Granulocytes: 1 %
Lymphocytes Relative: 12 %
Lymphs Abs: 1 10*3/uL (ref 0.7–4.0)
MCH: 29.4 pg (ref 26.0–34.0)
MCHC: 32.4 g/dL (ref 30.0–36.0)
MCV: 90.7 fL (ref 80.0–100.0)
Monocytes Absolute: 0.6 10*3/uL (ref 0.1–1.0)
Monocytes Relative: 7 %
Neutro Abs: 6.6 10*3/uL (ref 1.7–7.7)
Neutrophils Relative %: 76 %
Platelets: 142 10*3/uL — ABNORMAL LOW (ref 150–400)
RBC: 4.29 MIL/uL (ref 4.22–5.81)
RDW: 14.4 % (ref 11.5–15.5)
WBC: 8.5 10*3/uL (ref 4.0–10.5)
nRBC: 0 % (ref 0.0–0.2)

## 2022-11-28 LAB — URINALYSIS, ROUTINE W REFLEX MICROSCOPIC
Bilirubin Urine: NEGATIVE
Glucose, UA: NEGATIVE mg/dL
Hgb urine dipstick: NEGATIVE
Ketones, ur: 20 mg/dL — AB
Leukocytes,Ua: NEGATIVE
Nitrite: NEGATIVE
Protein, ur: NEGATIVE mg/dL
Specific Gravity, Urine: 1.019 (ref 1.005–1.030)
pH: 6 (ref 5.0–8.0)

## 2022-11-28 LAB — CBG MONITORING, ED: Glucose-Capillary: 116 mg/dL — ABNORMAL HIGH (ref 70–99)

## 2022-11-28 LAB — RESP PANEL BY RT-PCR (RSV, FLU A&B, COVID)  RVPGX2
Influenza A by PCR: NEGATIVE
Influenza B by PCR: NEGATIVE
Resp Syncytial Virus by PCR: POSITIVE — AB
SARS Coronavirus 2 by RT PCR: NEGATIVE

## 2022-11-28 LAB — MAGNESIUM: Magnesium: 2.1 mg/dL (ref 1.7–2.4)

## 2022-11-28 LAB — BASIC METABOLIC PANEL
Anion gap: 9 (ref 5–15)
BUN: 16 mg/dL (ref 8–23)
CO2: 22 mmol/L (ref 22–32)
Calcium: 8.3 mg/dL — ABNORMAL LOW (ref 8.9–10.3)
Chloride: 105 mmol/L (ref 98–111)
Creatinine, Ser: 0.96 mg/dL (ref 0.61–1.24)
GFR, Estimated: >60 mL/min (ref >60)
Glucose, Bld: 124 mg/dL — ABNORMAL HIGH (ref 70–99)
Potassium: 4 mmol/L (ref 3.5–5.1)
Sodium: 136 mmol/L (ref 135–145)

## 2022-11-28 LAB — TROPONIN I (HIGH SENSITIVITY)
Troponin I (High Sensitivity): 3 ng/L (ref <18)
Troponin I (High Sensitivity): 3 ng/L (ref <18)

## 2022-11-28 MED ORDER — SODIUM CHLORIDE 0.9 % IV BOLUS
1000.0000 mL | Freq: Once | INTRAVENOUS | Status: AC
  Administered 2022-11-28: 1000 mL via INTRAVENOUS

## 2022-11-28 NOTE — ED Notes (Signed)
Pt ambulated to the bathroom on RA.  Spo2 99%, pt tolerated this well

## 2022-11-28 NOTE — ED Provider Notes (Signed)
[facility] Provider Note   CSN: [id number] Arrival date & time: 11/28/22  1043     History  Chief Complaint  Patient presents with   Near Syncope    Andrew Newton is a 77 y.o. 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  He was sitting at his table talking to his son when he developed a lightheaded sensation in the back of his head, after which he became nauseated, he laid his head down on the table initially but started to feel worse and asked his son to help him to the floor.  He became very diaphoretic and had an episode of vomiting, and his symptoms slowly resolved.  He is currently symptom-free except he feels very fatigued.  He denies having any headache, focal weakness or numbness, chest pain, palpitations, shortness of breath or abdominal pain during or since this event.  He ate a normal breakfast this morning and felt well until this event happened, just before arriving here. denies any risk for dehydration.  He has had recent URI/sinus symptoms and has been taking some OTC medications including Mucinex, then added Sudafed over the past several days.  He has had no treatment prior to arrival.  The history is provided by the patient and the spouse.       Home Medications Prior to Admission medications   Medication Sig Start Date End Date Taking? Authorizing Provider  aspirin EC 81 MG tablet Take 81 mg by mouth 4 (four) times a week.    Yes [provider]  ibuprofen (ADVIL,MOTRIN) 200 MG tablet Take 400 mg by mouth daily as needed for headache or moderate pain.   Yes [provider]  HYDROcodone-acetaminophen (NORCO) 5-325 MG tablet Take 1 tablet by mouth every 6 (six) hours as needed for moderate pain. Patient not taking:  Reported on 11/28/2022 05/24/17   Aviva Signs, MD      Allergies    Patient has no known allergies.    Review of Systems   Review of Systems  Constitutional:  Negative for appetite change and fever.  HENT:  Positive for congestion.   Eyes: Negative.   Respiratory:  Negative for chest tightness and shortness of breath.   Cardiovascular:  Negative for chest pain, palpitations and leg swelling.  Gastrointestinal:  Positive for nausea and vomiting. Negative for abdominal pain.  Genitourinary: Negative.   Musculoskeletal:  Negative for arthralgias, joint swelling and neck pain.  Skin: Negative.  Negative for rash and wound.  Neurological:  Positive for syncope. Negative for dizziness, speech difficulty, weakness, light-headedness, numbness and headaches.  Hematological:  Bruises/bleeds easily.  Psychiatric/Behavioral: Negative.    All other systems reviewed and are negative.   Physical Exam Updated Vital Signs BP (!) 144/91   Pulse 69   Temp 98.3 F (36.8 C) (Oral)   Resp 16   Ht 6' (1.829 m)   Wt 108.9 kg   SpO2 98%   BMI 32.55 kg/m  Physical Exam Vitals and nursing note reviewed.  Constitutional:      General: She is not in acute distress.    Appearance: She is well-developed.  HENT:     Head: Normocephalic and atraumatic.     Mouth/Throat:     Mouth: Mucous membranes are moist.  Eyes:     Extraocular  Movements: Extraocular movements intact.     Conjunctiva/sclera: Conjunctivae normal.     Pupils: Pupils are equal, round, and reactive to light.  Cardiovascular:     Rate and Rhythm: Normal rate and regular rhythm.     Pulses: Normal pulses.     Heart sounds: Normal heart sounds. No murmur heard. Pulmonary:     Effort: Pulmonary effort is normal.     Breath sounds: Normal breath sounds. No wheezing.  Abdominal:     General: Bowel sounds are normal.     Palpations: Abdomen is soft.     Tenderness: There is no abdominal tenderness.  Musculoskeletal:         General: Normal range of motion.     Cervical back: Normal range of motion.  Skin:    General: Skin is warm and dry.     Coloration: Skin is pale.  Neurological:     General: No focal deficit present.     Mental Status: She is alert and oriented to person, place, and time.     ED Results / Procedures / Treatments   Labs (all labs ordered are listed, but only abnormal results are displayed) Labs Reviewed  RESP PANEL BY RT-PCR (RSV, FLU A&B, COVID)  RVPGX2 - Abnormal; Notable for the following components:      Result Value   Resp Syncytial Virus by PCR POSITIVE (*)    All other components within normal limits  BASIC METABOLIC PANEL - Abnormal; Notable for the following components:   Glucose, Bld 124 (*)    Calcium 8.3 (*)    All other components within normal limits  CBC WITH DIFFERENTIAL/PLATELET - Abnormal; Notable for the following components:   Hemoglobin 12.6 (*)    HCT 38.9 (*)    Platelets 142 (*)    All other components within normal limits  URINALYSIS, ROUTINE W REFLEX MICROSCOPIC - Abnormal; Notable for the following components:   Ketones, ur 20 (*)    All other components within normal limits  CBG MONITORING, ED - Abnormal; Notable for the following components:   Glucose-Capillary 116 (*)    All other components within normal limits  MAGNESIUM  TROPONIN I (HIGH SENSITIVITY)  TROPONIN I (HIGH SENSITIVITY)    EKG EKG Interpretation  Date/Time:  Wednesday November 28 2022 10:56:24 EST Ventricular Rate:  60 PR Interval:  188 QRS Duration: 109 QT Interval:  434 QTC Calculation: 434 R Axis:   28 Text Interpretation: Sinus rhythm Incomplete left bundle branch block No significant change since last tracing Confirmed by Andrew Newton [phone]) on 11/28/2022 11:26:56 AM  Radiology DG Chest Portable 1 View  Result Date: 11/28/2022 CLINICAL DATA:  Syncope, nausea, vomiting EXAM: PORTABLE CHEST 1 VIEW COMPARISON:  None Available. FINDINGS: Cardiomegaly. Both lungs are  clear. The visualized skeletal structures are unremarkable. IMPRESSION: Cardiomegaly without acute abnormality of the lungs in AP portable projection Electronically Signed   By: Delanna Ahmadi M.D.   On: 11/28/2022 12:03    Procedures Procedures    Medications Ordered in ED Medications  sodium chloride 0.9 % bolus 1,000 mL (0 mLs Intravenous Stopped 11/28/22 1422)    ED Course/ Medical Decision Making/ A&P                             Medical Decision Making Patient with a recent history of self-described a mild URI type symptoms presenting with a near syncopal event which occurred just prior to arrival involving  lightheadedness, nausea and emesis x 1, diaphoresis.  Now resolved, no chest pain no shortness of breath patient has been afebrile.  Differential diagnosis including dehydration, electrolyte or metabolic abnormality, cardiac arrhythmia medication, ACS or PE, although patient has no complaints of chest pain or shortness of breath therefore not likely.  Labs and IV fluids given, respiratory panel has been collected.  Patient has tested positive for RSV, all other labs are reassuring including a normal WBC count at 8.5, he remained on the monitor during his ED stay, there was no ectopy or arrhythmias noted.  He was not orthostatic.  He was given IV fluids.  He was also ambulated in the department and he felt well with no desaturation.  He was stable at time of discharge, strict return precautions were given, he was also advised recheck with his PCP within a week.  Amount and/or Complexity of Data Reviewed Labs: ordered.    Details: Per above Radiology: ordered.    Details: Chest x-ray is stable, mild cardiomegaly, lungs are clear. ECG/medicine tests: ordered.    Details: Reviewed, incomplete left bundle branch block.  Rate 60  Risk Decision regarding hospitalization. Risk Details: No indication for hospitalization.           Final Clinical Impression(s) / ED  Diagnoses Final diagnoses:  RSV (acute bronchiolitis due to respiratory syncytial virus)  Near syncope  RSV (respiratory syncytial virus infection)    Rx / DC Orders ED Discharge Orders     None         Landis Martins 11/28/22 1533    Andrew Pence, MD 11/29/22 [phone]

## 2022-11-28 NOTE — Discharge Instructions (Signed)
Rest make sure you are drinking plenty of fluids.  Get rechecked immediately if you develop any worsening symptoms, especially shortness of breath or return of your weakness or lightheadedness.

## 2022-11-28 NOTE — ED Triage Notes (Signed)
Pt presents to ED via [facility] for near syncopal episode and dizziness. Pt vomited x 1, pale and clammy, c/o headache. Pt denies CP. States had sudden onset of dizziness and felt like he was going to pass out.

## 2022-12-05 ENCOUNTER — Telehealth: Payer: Self-pay

## 2022-12-05 NOTE — Telephone Encounter (Signed)
        Patient  visited [facility] on 1/31   Telephone encounter attempt :  1st  A HIPAA compliant voice message was left requesting a return call.  Instructed patient to call back .    [facility] [phone] [address], [place], [place] [address] Phone: [phone] Email: [email or web address]

## 2022-12-06 ENCOUNTER — Telehealth: Payer: Self-pay

## 2022-12-06 NOTE — Telephone Encounter (Signed)
     Patient  visit  [facility]    1/31  Have you been able to follow up with your primary care physician? Yes   The patient was or was not able to obtain any needed medicine or equipment. Na   Are there diet recommendations that you are having difficulty following? Na   Patient expresses understanding of discharge instructions and education provided has no other needs at this time.  Yes      [facility] [phone] [address], [place], [place] [address] Phone: [phone] Email: [email or web address]

## 2023-01-05 DIAGNOSIS — H2513 Age-related nuclear cataract, bilateral: Secondary | ICD-10-CM | POA: Diagnosis not present

## 2023-08-09 DIAGNOSIS — Z23 Encounter for immunization: Secondary | ICD-10-CM | POA: Diagnosis not present

## 2023-10-02 DIAGNOSIS — E785 Hyperlipidemia, unspecified: Secondary | ICD-10-CM | POA: Diagnosis not present

## 2023-10-02 DIAGNOSIS — I493 Ventricular premature depolarization: Secondary | ICD-10-CM | POA: Diagnosis not present

## 2023-10-02 DIAGNOSIS — Z125 Encounter for screening for malignant neoplasm of prostate: Secondary | ICD-10-CM | POA: Diagnosis not present

## 2023-10-02 DIAGNOSIS — Z79899 Other long term (current) drug therapy: Secondary | ICD-10-CM | POA: Diagnosis not present

## 2023-10-02 DIAGNOSIS — R7303 Prediabetes: Secondary | ICD-10-CM | POA: Diagnosis not present

## 2023-10-09 DIAGNOSIS — R7309 Other abnormal glucose: Secondary | ICD-10-CM | POA: Diagnosis not present

## 2023-10-09 DIAGNOSIS — Z0000 Encounter for general adult medical examination without abnormal findings: Secondary | ICD-10-CM | POA: Diagnosis not present

## 2023-10-09 DIAGNOSIS — I87331 Chronic venous hypertension (idiopathic) with ulcer and inflammation of right lower extremity: Secondary | ICD-10-CM | POA: Diagnosis not present

## 2023-10-09 DIAGNOSIS — E785 Hyperlipidemia, unspecified: Secondary | ICD-10-CM | POA: Diagnosis not present

## 2023-12-07 DIAGNOSIS — H2513 Age-related nuclear cataract, bilateral: Secondary | ICD-10-CM | POA: Diagnosis not present

## 2024-05-19 DIAGNOSIS — R079 Chest pain, unspecified: Secondary | ICD-10-CM | POA: Diagnosis not present

## 2024-05-19 DIAGNOSIS — S90935A Unspecified superficial injury of left lesser toe(s), initial encounter: Secondary | ICD-10-CM | POA: Diagnosis not present

## 2024-05-19 DIAGNOSIS — R0602 Shortness of breath: Secondary | ICD-10-CM | POA: Diagnosis not present

## 2024-05-20 ENCOUNTER — Ambulatory Visit: Attending: Cardiology | Admitting: Cardiology

## 2024-05-20 ENCOUNTER — Encounter: Payer: Self-pay | Admitting: Cardiology

## 2024-05-20 VITALS — BP 124/68 | HR 66 | Ht 72.0 in | Wt 259.9 lb

## 2024-05-20 DIAGNOSIS — R0602 Shortness of breath: Secondary | ICD-10-CM

## 2024-05-20 NOTE — Progress Notes (Signed)
 Cardiology Office Note   Date:  05/20/2024  ID:  Vash Quezada, DOB November 27, 1945, MRN [id number] PCP: Sheryle Carwin, MD  Medical [facility] Providers Cardiologist:  None   History of Present Illness Andrew Newton is a 78 y.o. male with a past medical history of cataracts, HLD, PVCs. Presents today for evaluation of shortness of breath and chest discomfort   On interview, patient presents that about 3 weeks ago, he fell out of his bed and ended up breaking his toe.  Since then, he has noticed a bit of shortness of breath on exertion when mowing the lawn.  He uses a mower that he has to walk with mow a small part of his yard, then rides a riding lawnmower for the rest.  Then he will use a weed Wacker to trim the edges.  He noted that he got short of breath when walking with the lawnmower and had to take a couple of breaks to catch his breath.  He was able to ride the lawnmower and use the weed [place] without difficulty.  When he was short of breath, he had a bit of tightness on the right side of his chest.  Symptoms went away with rest.  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  Denies orthopnea, lower extremity swelling, weight gain.  He denies family history of heart problems.  Does not smoke cigarettes.  No history of high blood pressure, high cholesterol, or diabetes.  When he fell off his bed, he also got a bruise on the right side of his abdomen. Area is not tender to palpation. Does not have pleuritic chest pain   Studies Reviewed  Risk Assessment/Calculations           Physical Exam VS:  BP 124/68   Pulse 66   Ht 6' (1.829 m)   Wt 259 lb 14.4 oz (117.9 kg)   SpO2 94%   BMI 35.25 kg/m        Wt Readings from Last 3 Encounters:  05/20/24 259 lb 14.4 oz (117.9 kg)  11/28/22 240 lb (108.9 kg)  05/30/17 252 lb (114.3 kg)    GEN: Well nourished, well developed in no acute distress. Sitting upright on the exam table  NECK: No  JVD  CARDIAC:  RRR, no murmurs, rubs, gallops. Radial pulses 2+ bilaterally  RESPIRATORY:  Clear to auscultation without rales, wheezing or rhonchi. Normal WOB on room air   ABDOMEN: Soft, non-tender, non-distended EXTREMITIES:  No edema in BLE; No deformity   ASSESSMENT AND PLAN  Dyspnea on exertion  Chest tightness  - Patient fell out of bed 3 weeks ago and broke his toe. Since then, he has had some dyspnea on exertion with associated, mild chest tightness on the right side of his chest. Reports that he developed shortness of breath when using a push mower on a very hot day. Developed some chest tightness worse on the right side of his chest with this. He was able to ride a riding Surveyor, mining and use a week wacker without symptoms. 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 Denies orthopnea, leg swelling  - Patient not tachycardic, no pleuritic chest pain. No recent travel or long sedentary periods. No leg swelling. Very low suspicion for PE  - EKG from PCP office yesterday showed NSR, three times per week in lead III, V1, V2. Overall unchanged  when compared to EKG in 2018  - Patient denies family history of CAD. Does not use tobacco. No history of HTN, diabetes, high cholesterol  - Ordered echocardiogram  - Encouraged patient to continue to increase physical activity as tolerated   Dispo: Follow up in 3 months   Signed, Rollo FABIENE Louder, PA-C

## 2024-05-20 NOTE — Patient Instructions (Signed)
 Medication Instructions:  No changes *If you need a refill on your cardiac medications before your next appointment, please call your pharmacy*  Lab Work: No labs  Testing/Procedures: Your physician has requested that you have an echocardiogram. Echocardiography is a painless test that uses sound waves to create images of your heart. It provides your doctor with information about the size and shape of your heart and how well your heart's chambers and valves are working. This procedure takes approximately one hour. There are no restrictions for this procedure. Please do NOT wear cologne, perfume, aftershave, or lotions (deodorant is allowed). Please arrive 15 minutes prior to your appointment time.  Please note: We ask at that you not bring children with you during ultrasound (echo/ vascular) testing. Due to room size and safety concerns, children are not allowed in the ultrasound rooms during exams. Our front office staff cannot provide observation of children in our lobby area while testing is being conducted. An adult accompanying a patient to their appointment will only be allowed in the ultrasound room at the discretion of the ultrasound technician under special circumstances. We apologize for any inconvenience.  Follow-Up: At [facility], you and your health needs are our priority.  As part of our continuing mission to provide you with exceptional heart care, our providers are all part of one team.  This team includes your primary Cardiologist (physician) and Advanced Practice Providers or APPs (Physician Assistants and Nurse Practitioners) who all work together to provide you with the care you need, when you need it.  Your next appointment:   3 month(s)  Provider:   Katlyn West, NP  We recommend signing up for the patient portal called MyChart.  Sign up information is provided on this After Visit Summary.  MyChart is used to connect with patients for Virtual Visits  (Telemedicine).  Patients are able to view lab/test results, encounter notes, upcoming appointments, etc.  Non-urgent messages can be sent to your provider as well.   To learn more about what you can do with MyChart, go to [email or web address].

## 2024-06-25 ENCOUNTER — Ambulatory Visit (HOSPITAL_COMMUNITY)
Admission: RE | Admit: 2024-06-25 | Discharge: 2024-06-25 | Disposition: A | Discharge status: Home / Self Care | Source: Ambulatory Visit | Attending: Cardiology | Admitting: Cardiology

## 2024-06-25 DIAGNOSIS — R0602 Shortness of breath: Secondary | ICD-10-CM | POA: Diagnosis not present

## 2024-06-26 ENCOUNTER — Ambulatory Visit: Payer: Self-pay | Admitting: Cardiology

## 2024-06-26 LAB — ECHOCARDIOGRAM COMPLETE
Area-P 1/2: 2.8 cm2
P 1/2 time: 599 ms
S' Lateral: 3.7 cm

## 2024-07-08 NOTE — Telephone Encounter (Signed)
 Patient returned RN's call regarding results.

## 2024-07-09 ENCOUNTER — Other Ambulatory Visit: Payer: Self-pay

## 2024-07-09 DIAGNOSIS — R0602 Shortness of breath: Secondary | ICD-10-CM

## 2024-07-09 NOTE — Telephone Encounter (Signed)
 Patient is calling back because no one as return his call. Patient is upset. Please advise

## 2024-08-19 NOTE — H&P (View-Only) (Signed)
 Cardiology Office Note:    Date:  08/20/2024   ID:  Deveron Shamoon, DOB [birth year], MRN [id number]  PCP:  Sheryle Carwin, MD   [facility] Providers Cardiologist:  Alm Clay, MD     Referring MD: Sheryle Carwin, MD   Chief complaint: Follow-up CP and SOB     History of Present Illness:   Andrew Newton is a 78 y.o. 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  Patient states in 1997 he had swelling to the left leg secondary to venous insufficiency with 27 blood clots, and that there was a procedure performed to tie off his vein. No documentation present in EMR. Patient unsure whether this was a superficial or deep vein, has not been on long-term anticoagulants.  For seen by our services 05/20/2024 with Rollo Louder, no prior cardiac history.  Noted shortness of breath when mowing the lawn in July, reported associated chest tightness on the right side.  Symptoms improved with rest.  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   Echo 06/25/2024: LVEF 60-65% no RWMA, mild LVH, G1 DD, mild-moderate mitral valve regurgitation.  Mild aortic regurgitation.  Regurgitation PHT measuring 599 msec. Aortic dilatation observed in the aortic root (40 mm), mild dilatation of the ascending aorta (42 mm)  Presents with his wife to clinic today, appears stable from a cardiovascular standpoint.  Reports substernal chest pain/tightness occurring with moderate activity, relieved with rest.  Reports over the last 3 months whenever he goes to push mow his yard he notices chest tightness radiating to his left shoulder combined with significant DOE and occasional lightheadedness.  Patient rests, take slow breaths, symptoms ease.  Denies  symptoms at rest.  Denies SOB at rest, palpitations, dizziness, near-syncope, nausea, dark/tarry/bloody stools, hematuria, leg edema.  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  ROS:   Please see the history of present illness.     All other systems reviewed and are negative.     Past Medical History:  Diagnosis Date   Arthritis    Blood clot in vein 1997    Past Surgical History:  Procedure Laterality Date   COLONOSCOPY N/A 01/11/2016   Procedure: COLONOSCOPY;  Surgeon: Claudis RAYMOND Rivet, MD;  Location: [facility];  Service: Endoscopy;  Laterality: N/A;  730   INGUINAL HERNIA REPAIR Left 05/24/2017   Procedure: LEFT INGUINAL HERNIORRHAPHY WITH MESH;  Surgeon: Mavis Anes, MD;  Location: [facility] ORS;  Service: General;  Laterality: Left;   NECK SURGERY     VASCULAR SURGERY      Current Medications: Current Meds  Medication Sig   aspirin EC 81 MG tablet Take 81 mg by mouth 4 (four) times a week.    ibuprofen (ADVIL,MOTRIN) 200 MG tablet Take 400 mg by mouth daily as needed for headache or moderate pain.   nitroGLYCERIN (NITROSTAT) 0.4 MG SL tablet Place 1 tablet (0.4 mg total) under the tongue every 5 (five) minutes as needed.   rosuvastatin (CRESTOR) 20 MG tablet Take 1 tablet (20 mg total) by mouth daily.     Allergies:   Patient has no known allergies.   Social History   Socioeconomic History   Marital status: Married  Spouse name: Not on file   Number of children: Not on file   Years of education: Not on file   Highest education level: Not on file  Occupational History   Not on file  Tobacco Use   Smoking status: Former    Current packs/day: 0.00    Average packs/day: 0.3 packs/day for 5.0 years (1.3 ttl pk-yrs)    Types: Cigarettes    Start date: 05/22/1981    Quit date: 05/22/1986    Years since quitting: 38.2    Passive exposure: Never   Smokeless tobacco: Never  Vaping Use   Vaping status:  Never Used  Substance and Sexual Activity   Alcohol use: Yes    Comment: Occasional beer   Drug use: No   Sexual activity: Not on file  Other Topics Concern   Not on file  Social History Narrative   Not on file   Social Drivers of Health   Financial Resource Strain: Not on file  Food Insecurity: Not on file  Transportation Needs: Not on file  Physical Activity: Not on file  Stress: Not on file  Social Connections: Not on file     Family History: The patient's family history is not on file.  EKGs/Labs/Other Studies Reviewed:    The following studies were reviewed today:  EKG Interpretation Date/Time:  Thursday August 20 2024 08:11:40 EDT Ventricular Rate:  79 PR Interval:  172 QRS Duration:  92 QT Interval:  376 QTC Calculation: 431 R Axis:   -1  Text Interpretation: Normal sinus rhythm Inferior infarct , age undetermined Cannot rule out Anterior infarct , age undetermined T wave inversion in inferor leads, as well as V5/V6 Confirmed by Franci Oshana [phone]) on 08/20/2024 8:22:11 AM    Recent Labs: No results found for requested labs within last 365 days.  Recent Lipid Panel No results found for: CHOL, TRIG, HDL, CHOLHDL, VLDL, LDLCALC, LDLDIRECT    Physical Exam:    VS:  BP 126/78   Pulse 79   Ht 5' 11 (1.803 m)   Wt 265 lb 12.8 oz (120.6 kg)   SpO2 98%   BMI 37.07 kg/m        Wt Readings from Last 3 Encounters:  08/20/24 265 lb 12.8 oz (120.6 kg)  05/20/24 259 lb 14.4 oz (117.9 kg)  11/28/22 240 lb (108.9 kg)     GEN:  Well nourished, well developed in no acute distress HEENT: Normal NECK: No carotid bruits CARDIAC:  S1-S2 normal, RRR, no murmurs, rubs, gallops RESPIRATORY:  Clear to auscultation without rales, wheezing or rhonchi  MUSCULOSKELETAL:  1+ nonpitting edema; No deformity  SKIN: Warm and dry NEUROLOGIC:  Alert and oriented x 3 PSYCHIATRIC:  Normal affect       Assessment & Plan Stable angina DOE (dyspnea on  exertion) Echo 06/25/2024: LVEF 60-65%, normal LV function, no RWMA, mild concentric LVH, G1 DD, normal RV, mild-moderate mitral regurg, mild aortic valve regurgitation, no stenosis present.  Aortic dilatation observed in the aortic root (40 mm), mild dilatation of the ascending aorta (42 mm) EKG: NSR, inferior infarct, age undetermined, cannot rule out anterior infarct, age undetermined, T wave inversion in inferolateral leads, as well as V5/V6 Patient reports substernal chest tightness radiating to left shoulder with associated moderate SOB/lightheadedness occurring with moderate-strenuous activity, lasting 3-5 minutes at a time, relieved with ease of physical activity and deep breathing ongoing x 3-4 months, primarily occurring when mowing the yard. Discussed case with DOD, Dr. Delford, who agreed  patient symptoms combined with EKG changes would need either coronary CTA or left heart catheterization.   Discussed risks and benefits of both options with patient and wife. Through shared decision making with the family, they would prefer proceeding with left heart catheterization. Prescribed sublingual nitroglycerin 0.4 mg as needed chest pain every 5 minutes X3 doses Discussed appropriate conditions for using nitroglycerin, when to call EMS, strict ED precautions Continue aspirin EC 81 mg daily Labs 05/19/2024 from PCP office (referral note): LDL 124 Will start Crestor 20 mg daily Will order a CBC and a CMP  Plan to follow-up 2 weeks post cardiac catheterization     Informed Consent   Shared Decision Making/Informed Consent The risks [stroke (1 in 1000), death (1 in 1000), kidney failure [usually temporary] (1 in 500), bleeding (1 in 200), allergic reaction [possibly serious] (1 in 200)], benefits (diagnostic support and management of coronary artery disease) and alternatives of a cardiac catheterization were discussed in detail with Mr. Khader and he is willing to proceed.       Medication  Adjustments/Labs and Tests Ordered: Current medicines are reviewed at length with the patient today.  Concerns regarding medicines are outlined above.  Orders Placed This Encounter  Procedures   CBC   Comprehensive Metabolic Panel (CMET)   EKG 12-Lead   Meds ordered this encounter  Medications   rosuvastatin (CRESTOR) 20 MG tablet    Sig: Take 1 tablet (20 mg total) by mouth daily.    Dispense:  90 tablet    Refill:  1   nitroGLYCERIN (NITROSTAT) 0.4 MG SL tablet    Sig: Place 1 tablet (0.4 mg total) under the tongue every 5 (five) minutes as needed.    Dispense:  25 tablet    Refill:  3    Patient Instructions  Medication Instructions:  START Crestor 20mg  Take 1 tablet once a day  START Nitroglycerin 0.4mg  Take 1 as needed for emergency chest pain. Take first dose for emergency chest pain; WAIT 5 minutes and then take 2nd dose. IF still having pain if still having chest pain CALL 911 wait an additional 5 minutes before taking final dose. Do not take more than 3 doses in a day. DO NOT TAKE 72 HOURS PRIOR TO OR 72 HOURS AFTER TAKING ERECTILE DYSFUNCTION MEDIATIONS.  *If you need a refill on your cardiac medications before your next appointment, please call your pharmacy*  Lab Work: TODAY-CMET & CBC If you have labs (blood work) drawn today and your tests are completely normal, you will receive your results only by: MyChart Message (if you have MyChart) OR A paper copy in the mail If you have any lab test that is abnormal or we need to change your treatment, we will call you to review the results.  Testing/Procedures: Your physician has requested that you have a cardiac catheterization. Cardiac catheterization is used to diagnose and/or treat various heart conditions. Doctors may recommend this procedure for a number of different reasons. The most common reason is to evaluate chest pain. Chest pain can be a symptom of coronary artery disease (CAD), and cardiac catheterization can  show whether plaque is narrowing or blocking your heart's arteries. This procedure is also used to evaluate the valves, as well as measure the blood flow and oxygen levels in different parts of your heart. For further information please visit [email or web address] Please follow instruction sheet, as given.   Follow-Up: At [facility], you and your health needs are our priority.  As  part of our continuing mission to provide you with exceptional heart care, our providers are all part of one team.  This team includes your primary Cardiologist (physician) and Advanced Practice Providers or APPs (Physician Assistants and Nurse Practitioners) who all work together to provide you with the care you need, when you need it.  Your next appointment:   2 week(s)  Provider:   ANY APP    We recommend signing up for the patient portal called MyChart.  Sign up information is provided on this After Visit Summary.  MyChart is used to connect with patients for Virtual Visits (Telemedicine).  Patients are able to view lab/test results, encounter notes, upcoming appointments, etc.  Non-urgent messages can be sent to your provider as well.   To learn more about what you can do with MyChart, go to [email or web address].   Other Instructions PLEASE GO TO THE EMERGENCY ROOM IF YOU EXPERIENCE CHEST PAIN, SHORTNESS OF BREATH, NAUSEA OR FEELING LIKE YOU RE GOING TO PASS OUT.           Signed, Miriam FORBES Shams, NP  08/20/2024 9:47 AM    [facility]

## 2024-08-19 NOTE — Progress Notes (Unsigned)
 Cardiology Office Note:    Date:  08/20/2024   ID:  Daire Okimoto, DOB 31-Jan-1946, MRN [id number]  PCP:  Sheryle Carwin, MD   [facility] Providers Cardiologist:  Alm Clay, MD { Click to update primary MD,subspecialty MD or APP then REFRESH:1}    Referring MD: Sheryle Carwin, MD   Chief complaint: Follow-up CP and SOB     History of Present Illness:   Bodee Lafoe is a 78 y.o. male with a hx of HLD, PVCs, remote history of blood clot in vein in 1997, presents to office today for follow-up of recent complaints of chest pain and shortness of breath.  For seen by our services 05/20/2024 with Rollo Louder.  Noted shortness of breath when mowing the lawn in July, reported associated chest tightness on the right side.  Symptoms improved with rest.  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  Reported about a month prior he had fallen out of his bed ending up with a broken toe and a bruise to his abdomen tender during palpation on exam.  Echo 06/25/2024: LVEF 60-65% no RWMA, mild LVH, G1 DD, mild-moderate mitral valve regurgitation.  Mild aortic regurgitation.  Regurgitation PHT measuring 599 msec  Presents with his wife to clinic today, appears stable from a cardiovascular standpoint.  Reports substernal chest pain/tightness occurring with moderate activity, relieved with rest.  Reports over the last 3 months whenever he goes to push mow his yard he notices chest tightness radiating to his left shoulder combined with significant DOE and occasional lightheadedness.  Patient rests, take slow breaths, symptoms ease.  Denies symptoms at rest.  Denies SOB at rest, palpitations, dizziness, near-syncope, nausea, dark/tarry/bloody stools, hematuria, leg edema.  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  ROS:   Please see the history of present  illness.     All other systems reviewed and are negative.     Past Medical History:  Diagnosis Date   Arthritis    Blood clot in vein 1997    Past Surgical History:  Procedure Laterality Date   COLONOSCOPY N/A 01/11/2016   Procedure: COLONOSCOPY;  Surgeon: Claudis RAYMOND Rivet, MD;  Location: [facility];  Service: Endoscopy;  Laterality: N/A;  730   INGUINAL HERNIA REPAIR Left 05/24/2017   Procedure: LEFT INGUINAL HERNIORRHAPHY WITH MESH;  Surgeon: Mavis Anes, MD;  Location: [facility] ORS;  Service: General;  Laterality: Left;   NECK SURGERY     VASCULAR SURGERY      Current Medications: Current Meds  Medication Sig   aspirin EC 81 MG tablet Take 81 mg by mouth 4 (four) times a week.    ibuprofen (ADVIL,MOTRIN) 200 MG tablet Take 400 mg by mouth daily as needed for headache or moderate pain.   nitroGLYCERIN (NITROSTAT) 0.4 MG SL tablet Place 1 tablet (0.4 mg total) under the tongue every 5 (five) minutes as needed.   rosuvastatin (CRESTOR) 20 MG tablet Take 1 tablet (20 mg total) by mouth daily.     Allergies:   Patient has no known allergies.   Social History   Socioeconomic History   Marital status: Married    Spouse name: Not on file   Number of children: Not on file   Years of education: Not on file   Highest education level: Not on file  Occupational History   Not on file  Tobacco Use  Smoking status: Former    Current packs/day: 0.00    Average packs/day: 0.3 packs/day for 5.0 years (1.3 ttl pk-yrs)    Types: Cigarettes    Start date: 05/22/1981    Quit date: 05/22/1986    Years since quitting: 38.2    Passive exposure: Never   Smokeless tobacco: Never  Vaping Use   Vaping status: Never Used  Substance and Sexual Activity   Alcohol use: Yes    Comment: Occasional beer   Drug use: No   Sexual activity: Not on file  Other Topics Concern   Not on file  Social History Narrative   Not on file   Social Drivers of Health   Financial Resource Strain: Not on file   Food Insecurity: Not on file  Transportation Needs: Not on file  Physical Activity: Not on file  Stress: Not on file  Social Connections: Not on file     Family History: The patient's family history is not on file.  EKGs/Labs/Other Studies Reviewed:    The following studies were reviewed today:  EKG Interpretation Date/Time:  Thursday August 20 2024 08:11:40 EDT Ventricular Rate:  79 PR Interval:  172 QRS Duration:  92 QT Interval:  376 QTC Calculation: 431 R Axis:   -1  Text Interpretation: Normal sinus rhythm Inferior infarct , age undetermined Cannot rule out Anterior infarct , age undetermined T wave inversion in inferor leads, as well as V5/V6 Confirmed by Taeveon Keesling [id number]) on 08/20/2024 8:22:11 AM    Recent Labs: No results found for requested labs within last 365 days.  Recent Lipid Panel No results found for: CHOL, TRIG, HDL, CHOLHDL, VLDL, LDLCALC, LDLDIRECT    Physical Exam:    VS:  BP 126/78   Pulse 79   Ht 5' 11 (1.803 m)   Wt 265 lb 12.8 oz (120.6 kg)   SpO2 98%   BMI 37.07 kg/m        Wt Readings from Last 3 Encounters:  08/20/24 265 lb 12.8 oz (120.6 kg)  05/20/24 259 lb 14.4 oz (117.9 kg)  11/28/22 240 lb (108.9 kg)     GEN:  Well nourished, well developed in no acute distress HEENT: Normal NECK: No carotid bruits CARDIAC:  S1-S2 normal, RRR, no murmurs, rubs, gallops RESPIRATORY:  Clear to auscultation without rales, wheezing or rhonchi  MUSCULOSKELETAL:  1+ nonpitting edema; No deformity  SKIN: Warm and dry NEUROLOGIC:  Alert and oriented x 3 PSYCHIATRIC:  Normal affect       Assessment & Plan Stable angina DOE (dyspnea on exertion) Echo 06/25/2024: LVEF 60-65%, normal LV function, no RWMA, mild concentric LVH, G1 DD, normal RV, mild-moderate mitral regurg, mild aortic valve regurgitation, no stenosis present.  Aortic dilatation observed in the aortic root (40 mm), mild dilatation of the ascending aorta (42  mm) EKG: NSR, inferior infarct, age undetermined, cannot rule out anterior infarct, age undetermined, T wave inversion in inferolateral leads, as well as V5/V6 Substernal chest tightness radiating to left shoulder with associated moderate SOB/lightheadedness occurring with moderate-strenuous activity, lasting minutes at a time, relieved with ease of physical activity and deep breathing ongoing x 3-4 months, primarily occurring when mowing the yard. Discussed case with DOD, Dr. Delford, who agreed patient symptoms combined with EKG changes would need either coronary CTA or left heart catheterization. Discussed risks and benefits of both options with patient and wife, who agreed they would feel more comfortable proceeding with left heart catheterization. Prescribed sublingual nitroglycerin 0.4 mg as needed  chest pain every 5 minutes X3 doses Discussed appropriate conditions for using nitroglycerin, when to call EMS, strict ED precautions Continue aspirin EC 81 mg daily Labs 05/19/2024 from PCP office (referral note): LDL 124 Will start Crestor 20 mg daily Will order a CBC and a CMP  Plan to follow-up 2 weeks post cardiac catheterization     Informed Consent   Shared Decision Making/Informed Consent{ All outpatient stress tests require an informed consent ([id number]) ATTESTATION ORDER       :[id number]} The risks [stroke (1 in 1000), death (1 in 1000), kidney failure [usually temporary] (1 in 500), bleeding (1 in 200), allergic reaction [possibly serious] (1 in 200)], benefits (diagnostic support and management of coronary artery disease) and alternatives of a cardiac catheterization were discussed in detail with Mr. Jeanbaptiste and he is willing to proceed.       Medication Adjustments/Labs and Tests Ordered: Current medicines are reviewed at length with the patient today.  Concerns regarding medicines are outlined above.  Orders Placed This Encounter  Procedures   CBC   Comprehensive Metabolic  Panel (CMET)   EKG 12-Lead   Meds ordered this encounter  Medications   rosuvastatin (CRESTOR) 20 MG tablet    Sig: Take 1 tablet (20 mg total) by mouth daily.    Dispense:  90 tablet    Refill:  1   nitroGLYCERIN (NITROSTAT) 0.4 MG SL tablet    Sig: Place 1 tablet (0.4 mg total) under the tongue every 5 (five) minutes as needed.    Dispense:  25 tablet    Refill:  3    Patient Instructions  Medication Instructions:  START Crestor 20mg  Take 1 tablet once a day  START Nitroglycerin 0.4mg  Take 1 as needed for emergency chest pain. Take first dose for emergency chest pain; WAIT 5 minutes and then take 2nd dose. IF still having pain if still having chest pain CALL 911 wait an additional 5 minutes before taking final dose. Do not take more than 3 doses in a day. DO NOT TAKE 72 HOURS PRIOR TO OR 72 HOURS AFTER TAKING ERECTILE DYSFUNCTION MEDIATIONS.  *If you need a refill on your cardiac medications before your next appointment, please call your pharmacy*  Lab Work: TODAY-CMET & CBC If you have labs (blood work) drawn today and your tests are completely normal, you will receive your results only by: MyChart Message (if you have MyChart) OR A paper copy in the mail If you have any lab test that is abnormal or we need to change your treatment, we will call you to review the results.  Testing/Procedures: Your physician has requested that you have a cardiac catheterization. Cardiac catheterization is used to diagnose and/or treat various heart conditions. Doctors may recommend this procedure for a number of different reasons. The most common reason is to evaluate chest pain. Chest pain can be a symptom of coronary artery disease (CAD), and cardiac catheterization can show whether plaque is narrowing or blocking your heart's arteries. This procedure is also used to evaluate the valves, as well as measure the blood flow and oxygen levels in different parts of your heart. For further information  please visit [email or web address] Please follow instruction sheet, as given.   Follow-Up: At [facility], you and your health needs are our priority.  As part of our continuing mission to provide you with exceptional heart care, our providers are all part of one team.  This team includes your primary Cardiologist (physician) and Advanced Practice Providers  or APPs (Physician Assistants and Nurse Practitioners) who all work together to provide you with the care you need, when you need it.  Your next appointment:   2 week(s)  Provider:   ANY APP    We recommend signing up for the patient portal called MyChart.  Sign up information is provided on this After Visit Summary.  MyChart is used to connect with patients for Virtual Visits (Telemedicine).  Patients are able to view lab/test results, encounter notes, upcoming appointments, etc.  Non-urgent messages can be sent to your provider as well.   To learn more about what you can do with MyChart, go to [email or web address].   Other Instructions PLEASE GO TO THE EMERGENCY ROOM IF YOU EXPERIENCE CHEST PAIN, SHORTNESS OF BREATH, NAUSEA OR FEELING LIKE YOU RE GOING TO PASS OUT.           Signed, Miriam FORBES Shams, NP  08/20/2024 9:47 AM    [facility]

## 2024-08-20 ENCOUNTER — Ambulatory Visit: Attending: Internal Medicine | Admitting: Emergency Medicine

## 2024-08-20 ENCOUNTER — Encounter: Payer: Self-pay | Admitting: Cardiology

## 2024-08-20 VITALS — BP 126/78 | HR 79 | Ht 71.0 in | Wt 265.8 lb

## 2024-08-20 DIAGNOSIS — R0609 Other forms of dyspnea: Secondary | ICD-10-CM

## 2024-08-20 DIAGNOSIS — I2089 Other forms of angina pectoris: Secondary | ICD-10-CM | POA: Diagnosis not present

## 2024-08-20 MED ORDER — ROSUVASTATIN CALCIUM 20 MG PO TABS
20.0000 mg | ORAL_TABLET | Freq: Every day | ORAL | 1 refills | Status: DC
Start: 2024-08-20 — End: 2024-08-27

## 2024-08-20 MED ORDER — NITROGLYCERIN 0.4 MG SL SUBL
0.4000 mg | SUBLINGUAL_TABLET | SUBLINGUAL | 3 refills | Status: AC | PRN
Start: 2024-08-20 — End: 2024-11-18

## 2024-08-20 NOTE — Patient Instructions (Signed)
 Medication Instructions:  START Crestor 20mg  Take 1 tablet once a day  START Nitroglycerin 0.4mg  Take 1 as needed for emergency chest pain. Take first dose for emergency chest pain; WAIT 5 minutes and then take 2nd dose. IF still having pain if still having chest pain CALL 911 wait an additional 5 minutes before taking final dose. Do not take more than 3 doses in a day. DO NOT TAKE 72 HOURS PRIOR TO OR 72 HOURS AFTER TAKING ERECTILE DYSFUNCTION MEDIATIONS.  *If you need a refill on your cardiac medications before your next appointment, please call your pharmacy*  Lab Work: TODAY-CMET & CBC If you have labs (blood work) drawn today and your tests are completely normal, you will receive your results only by: MyChart Message (if you have MyChart) OR A paper copy in the mail If you have any lab test that is abnormal or we need to change your treatment, we will call you to review the results.  Testing/Procedures: Your physician has requested that you have a cardiac catheterization. Cardiac catheterization is used to diagnose and/or treat various heart conditions. Doctors may recommend this procedure for a number of different reasons. The most common reason is to evaluate chest pain. Chest pain can be a symptom of coronary artery disease (CAD), and cardiac catheterization can show whether plaque is narrowing or blocking your heart's arteries. This procedure is also used to evaluate the valves, as well as measure the blood flow and oxygen levels in different parts of your heart. For further information please visit [email or web address] Please follow instruction sheet, as given.        Cardiac/Peripheral Catheterization   You are scheduled for a Cardiac Catheterization on Wednesday, October 29 with Dr. Alm Clay.  1. Please arrive at the [facility] (Main Entrance A) at [facility]: [address] [place], [place] [address] at 9:30 AM (This time is 2 hour(s) before your procedure to  ensure your preparation).   Free valet parking service is available. You will check in at ADMITTING. The support person will be asked to wait in the waiting room.  It is OK to have someone drop you off and come back when you are ready to be discharged.        Special note: Every effort is made to have your procedure done on time. Please understand that emergencies sometimes delay scheduled procedures.  2. Diet: Nothing to eat after midnight.  3. Hydration:You need to be well hydrated before your procedure. On October 29, you may drink approved liquids (see below) until 2 hours before the procedure, with 16 oz of water  as your last intake.   List of approved liquids water , clear juice, clear tea, black coffee, fruit juices, non-citric and without pulp, carbonated beverages, Gatorade, Kool -Aid, plain Jello-O and plain ice popsicles.  4. Labs: today-1st floor  5. Medication instructions in preparation for your procedure:   Contrast Allergy: No  On the morning of your procedure, take Aspirin 81 mg and any morning medicines NOT listed above.  You may use sips of water .  6. Plan to go home the same day, you will only stay overnight if medically necessary. 7. You MUST have a responsible adult to drive you home. 8. An adult MUST be with you the first 24 hours after you arrive home. 9. Bring a current list of your medications, and the last time and date medication taken. 10. Bring ID and current insurance cards. 11.Please wear clothes that are easy to  get on and off and wear slip-on shoes.  Thank you for allowing us  to care for you!   -- [facility]  Follow-Up: At [facility], you and your health needs are our priority.  As part of our continuing mission to provide you with exceptional heart care, our providers are all part of one team.  This team includes your primary Cardiologist (physician) and Advanced Practice Providers or APPs (Physician  Assistants and Nurse Practitioners) who all work together to provide you with the care you need, when you need it.  Your next appointment:   2 week(s)  Provider:   ANY APP    We recommend signing up for the patient portal called MyChart.  Sign up information is provided on this After Visit Summary.  MyChart is used to connect with patients for Virtual Visits (Telemedicine).  Patients are able to view lab/test results, encounter notes, upcoming appointments, etc.  Non-urgent messages can be sent to your provider as well.   To learn more about what you can do with MyChart, go to [email or web address].   Other Instructions PLEASE GO TO THE EMERGENCY ROOM IF YOU EXPERIENCE CHEST PAIN, SHORTNESS OF BREATH, NAUSEA OR FEELING LIKE YOU RE GOING TO PASS OUT.

## 2024-08-21 ENCOUNTER — Ambulatory Visit: Payer: Self-pay | Admitting: Emergency Medicine

## 2024-08-21 LAB — COMPREHENSIVE METABOLIC PANEL WITH GFR
ALT: 22 IU/L (ref 0–44)
AST: 24 IU/L (ref 0–40)
Albumin: 4.4 g/dL (ref 3.8–4.8)
Alkaline Phosphatase: 63 IU/L (ref 47–123)
BUN/Creatinine Ratio: 13 (ref 10–24)
BUN: 15 mg/dL (ref 8–27)
Bilirubin Total: 0.6 mg/dL (ref 0.0–1.2)
CO2: 21 mmol/L (ref 20–29)
Calcium: 9 mg/dL (ref 8.6–10.2)
Chloride: 104 mmol/L (ref 96–106)
Creatinine, Ser: 1.16 mg/dL (ref 0.76–1.27)
Globulin, Total: 2.8 g/dL (ref 1.5–4.5)
Glucose: 98 mg/dL (ref 70–99)
Potassium: 4.9 mmol/L (ref 3.5–5.2)
Sodium: 140 mmol/L (ref 134–144)
Total Protein: 7.2 g/dL (ref 6.0–8.5)
eGFR: 65 mL/min/1.73 (ref >59)

## 2024-08-21 LAB — CBC
Hematocrit: 43.4 % (ref 37.5–51.0)
Hemoglobin: 13.7 g/dL (ref 13.0–17.7)
MCH: 27.7 pg (ref 26.6–33.0)
MCHC: 31.6 g/dL (ref 31.5–35.7)
MCV: 88 fL (ref 79–97)
Platelets: 183 x10E3/uL (ref 150–450)
RBC: 4.94 x10E6/uL (ref 4.14–5.80)
RDW: 13.7 % (ref 11.6–15.4)
WBC: 6.1 x10E3/uL (ref 3.4–10.8)

## 2024-08-25 ENCOUNTER — Telehealth: Payer: Self-pay | Admitting: *Deleted

## 2024-08-25 NOTE — Telephone Encounter (Addendum)
 Cardiac Catheterization scheduled at [facility] for: Wednesday August 26, 2024 11:30 AM Arrival time [facility] Main Entrance A at: 9:30 AM  Diet: -Nothing to eat after midnight.  Hydration: -May drink clear liquids until 2 hours before the procedure.  Approved liquids: Water, clear tea, black coffee, fruit juices-non-citric and without pulp,Gatorade, plain Jello/popsicles.   -Please drink 16 oz of water 2 hours before procedure.  Medication instructions: -Usual morning medications can be taken including aspirin 81 mg.  Plan to go home the same day, you will only stay overnight if medically necessary.  You must have responsible adult to drive you home.  Someone must be with you the first 24 hours after you arrive home.  Reviewed procedure instructions with patient.

## 2024-08-26 ENCOUNTER — Ambulatory Visit (HOSPITAL_COMMUNITY)
Admission: RE | Admit: 2024-08-26 | Discharge: 2024-08-27 | Disposition: A | Discharge status: Home / Self Care | Attending: Cardiology | Admitting: Cardiology

## 2024-08-26 ENCOUNTER — Other Ambulatory Visit: Payer: Self-pay

## 2024-08-26 ENCOUNTER — Encounter (HOSPITAL_COMMUNITY)
Admission: RE | Disposition: A | Discharge status: Home / Self Care | Payer: Self-pay | Source: Home / Self Care | Attending: Cardiology

## 2024-08-26 DIAGNOSIS — Z7982 Long term (current) use of aspirin: Secondary | ICD-10-CM | POA: Diagnosis not present

## 2024-08-26 DIAGNOSIS — I2582 Chronic total occlusion of coronary artery: Secondary | ICD-10-CM | POA: Diagnosis not present

## 2024-08-26 DIAGNOSIS — Z7902 Long term (current) use of antithrombotics/antiplatelets: Secondary | ICD-10-CM | POA: Diagnosis not present

## 2024-08-26 DIAGNOSIS — E785 Hyperlipidemia, unspecified: Secondary | ICD-10-CM | POA: Diagnosis not present

## 2024-08-26 DIAGNOSIS — I080 Rheumatic disorders of both mitral and aortic valves: Secondary | ICD-10-CM | POA: Diagnosis not present

## 2024-08-26 DIAGNOSIS — I2510 Atherosclerotic heart disease of native coronary artery without angina pectoris: Secondary | ICD-10-CM | POA: Diagnosis present

## 2024-08-26 DIAGNOSIS — I25110 Atherosclerotic heart disease of native coronary artery with unstable angina pectoris: Secondary | ICD-10-CM | POA: Insufficient documentation

## 2024-08-26 DIAGNOSIS — R0609 Other forms of dyspnea: Secondary | ICD-10-CM

## 2024-08-26 DIAGNOSIS — Z79899 Other long term (current) drug therapy: Secondary | ICD-10-CM | POA: Insufficient documentation

## 2024-08-26 DIAGNOSIS — I2089 Other forms of angina pectoris: Secondary | ICD-10-CM

## 2024-08-26 DIAGNOSIS — Z87891 Personal history of nicotine dependence: Secondary | ICD-10-CM | POA: Diagnosis not present

## 2024-08-26 DIAGNOSIS — I25119 Atherosclerotic heart disease of native coronary artery with unspecified angina pectoris: Secondary | ICD-10-CM | POA: Diagnosis not present

## 2024-08-26 HISTORY — PX: LEFT HEART CATH AND CORONARY ANGIOGRAPHY: CATH118249

## 2024-08-26 HISTORY — PX: CORONARY STENT INTERVENTION: CATH118234

## 2024-08-26 HISTORY — PX: CORONARY IMAGING/OCT: CATH118326

## 2024-08-26 LAB — POCT ACTIVATED CLOTTING TIME
Activated Clotting Time: 268 s
Activated Clotting Time: 256 s
Activated Clotting Time: 274 s
Activated Clotting Time: 291 s

## 2024-08-26 SURGERY — LEFT HEART CATH AND CORONARY ANGIOGRAPHY
Anesthesia: LOCAL

## 2024-08-26 MED ORDER — ACETAMINOPHEN 325 MG PO TABS: 650.0000 mg | ORAL_TABLET | ORAL | Status: DC | PRN

## 2024-08-26 MED ORDER — NITROGLYCERIN 1 MG/10 ML FOR IR/CATH LAB
INTRA_ARTERIAL | Status: DC | PRN
  Administered 2024-08-26: 200 ug via INTRACORONARY

## 2024-08-26 MED ORDER — SODIUM CHLORIDE 0.9% FLUSH: 3.0000 mL | Freq: Two times a day (BID) | INTRAVENOUS | Status: DC

## 2024-08-26 MED ORDER — IOHEXOL 350 MG/ML SOLN
INTRAVENOUS | Status: DC | PRN
  Administered 2024-08-26: 200 mL

## 2024-08-26 MED ORDER — HEPARIN (PORCINE) IN NACL 1000-0.9 UT/500ML-% IV SOLN
INTRAVENOUS | Status: DC | PRN
  Administered 2024-08-26: 1000 mL
  Administered 2024-08-26: 500 mL

## 2024-08-26 MED ORDER — SODIUM CHLORIDE 0.9 % IV SOLN: INTRAVENOUS | Status: AC

## 2024-08-26 MED ORDER — METOPROLOL TARTRATE 12.5 MG HALF TABLET
12.5000 mg | ORAL_TABLET | Freq: Two times a day (BID) | ORAL | Status: DC
  Administered 2024-08-26 – 2024-08-27 (×2): 12.5 mg via ORAL
  Filled 2024-08-26 (×2): qty 1

## 2024-08-26 MED ORDER — HEPARIN (PORCINE) IN NACL 2-0.9 UNITS/ML
INTRAMUSCULAR | Status: DC | PRN
  Administered 2024-08-26: 10 mL via INTRA_ARTERIAL

## 2024-08-26 MED ORDER — LIDOCAINE HCL (PF) 1 % IJ SOLN
INTRAMUSCULAR | Status: AC
  Filled 2024-08-26: qty 30

## 2024-08-26 MED ORDER — HEPARIN SODIUM (PORCINE) 1000 UNIT/ML IJ SOLN
INTRAMUSCULAR | Status: AC
  Filled 2024-08-26: qty 10

## 2024-08-26 MED ORDER — NITROGLYCERIN 1 MG/10 ML FOR IR/CATH LAB
INTRA_ARTERIAL | Status: AC
  Filled 2024-08-26: qty 10

## 2024-08-26 MED ORDER — CLOPIDOGREL BISULFATE 300 MG PO TABS
ORAL_TABLET | ORAL | Status: DC | PRN
  Administered 2024-08-26: 600 mg via ORAL

## 2024-08-26 MED ORDER — LIDOCAINE HCL (PF) 1 % IJ SOLN
INTRAMUSCULAR | Status: DC | PRN
  Administered 2024-08-26: 2 mL via INTRADERMAL

## 2024-08-26 MED ORDER — MIDAZOLAM HCL 2 MG/2ML IJ SOLN
INTRAMUSCULAR | Status: AC
  Filled 2024-08-26: qty 2

## 2024-08-26 MED ORDER — SODIUM CHLORIDE 0.9 % IV SOLN: 250.0000 mL | INTRAVENOUS | Status: DC | PRN

## 2024-08-26 MED ORDER — LABETALOL HCL 5 MG/ML IV SOLN: 10.0000 mg | INTRAVENOUS | Status: AC | PRN

## 2024-08-26 MED ORDER — ONDANSETRON HCL 4 MG/2ML IJ SOLN: 4.0000 mg | Freq: Four times a day (QID) | INTRAMUSCULAR | Status: DC | PRN

## 2024-08-26 MED ORDER — ROSUVASTATIN CALCIUM 20 MG PO TABS
40.0000 mg | ORAL_TABLET | Freq: Every day | ORAL | Status: DC
  Administered 2024-08-27: 40 mg via ORAL
  Filled 2024-08-26: qty 2

## 2024-08-26 MED ORDER — SODIUM CHLORIDE 0.9% FLUSH
3.0000 mL | INTRAVENOUS | Status: DC | PRN
Start: 2024-08-26 — End: 2024-08-27

## 2024-08-26 MED ORDER — SODIUM CHLORIDE 0.9% FLUSH: 3.0000 mL | INTRAVENOUS | Status: DC | PRN

## 2024-08-26 MED ORDER — HEPARIN SODIUM (PORCINE) 1000 UNIT/ML IJ SOLN
INTRAMUSCULAR | Status: DC | PRN
  Administered 2024-08-26: 3000 [IU] via INTRAVENOUS
  Administered 2024-08-26: 5000 [IU] via INTRAVENOUS
  Administered 2024-08-26: 3000 [IU] via INTRAVENOUS
  Administered 2024-08-26: 6000 [IU] via INTRAVENOUS

## 2024-08-26 MED ORDER — ASPIRIN 81 MG PO CHEW: 81.0000 mg | CHEWABLE_TABLET | ORAL | Status: DC

## 2024-08-26 MED ORDER — FENTANYL CITRATE (PF) 100 MCG/2ML IJ SOLN
INTRAMUSCULAR | Status: AC
  Filled 2024-08-26: qty 2

## 2024-08-26 MED ORDER — ASPIRIN 81 MG PO TBEC: 81.0000 mg | DELAYED_RELEASE_TABLET | ORAL | Status: DC

## 2024-08-26 MED ORDER — MORPHINE SULFATE (PF) 2 MG/ML IV SOLN: 2.0000 mg | INTRAVENOUS | Status: DC | PRN

## 2024-08-26 MED ORDER — VERAPAMIL HCL 2.5 MG/ML IV SOLN
INTRAVENOUS | Status: AC
  Filled 2024-08-26: qty 2

## 2024-08-26 MED ORDER — CLOPIDOGREL BISULFATE 75 MG PO TABS
75.0000 mg | ORAL_TABLET | Freq: Every day | ORAL | Status: DC
  Administered 2024-08-27: 75 mg via ORAL
  Filled 2024-08-26: qty 1

## 2024-08-26 MED ORDER — FREE WATER: 500.0000 mL | Freq: Once | Status: DC

## 2024-08-26 MED ORDER — SODIUM CHLORIDE 0.9% FLUSH
3.0000 mL | Freq: Two times a day (BID) | INTRAVENOUS | Status: DC
  Administered 2024-08-26 – 2024-08-27 (×2): 3 mL via INTRAVENOUS

## 2024-08-26 MED ORDER — HYDRALAZINE HCL 20 MG/ML IJ SOLN: 10.0000 mg | INTRAMUSCULAR | Status: AC | PRN

## 2024-08-26 MED ORDER — NITROGLYCERIN 0.4 MG SL SUBL: 0.4000 mg | SUBLINGUAL_TABLET | SUBLINGUAL | Status: DC | PRN

## 2024-08-26 MED ORDER — MIDAZOLAM HCL (PF) 2 MG/2ML IJ SOLN
INTRAMUSCULAR | Status: DC | PRN
  Administered 2024-08-26: 1 mg via INTRAVENOUS

## 2024-08-26 MED ORDER — CLOPIDOGREL BISULFATE 300 MG PO TABS
ORAL_TABLET | ORAL | Status: AC
  Filled 2024-08-26: qty 2

## 2024-08-26 MED ORDER — FENTANYL CITRATE (PF) 100 MCG/2ML IJ SOLN
INTRAMUSCULAR | Status: DC | PRN
  Administered 2024-08-26: 50 ug via INTRAVENOUS

## 2024-08-26 SURGICAL SUPPLY — 21 items
BALLOON EMERGE MR 3.0X12 (BALLOONS) IMPLANT
BALLOON EMERGE MR 3.5X12 (BALLOONS) IMPLANT
BALLOON SAPPHIRE NC24 2.75X12 (BALLOONS) IMPLANT
BALLOON SAPPHIRE NC24 3.25X15 (BALLOONS) IMPLANT
CATH DRAGONFLY OPSTAR (CATHETERS) IMPLANT
CATH INFINITI 5 FR JL3.5 (CATHETERS) IMPLANT
CATH INFINITI AMBI 5FR TG (CATHETERS) IMPLANT
CATH INFINITI JR4 5F (CATHETERS) IMPLANT
CATH VISTA GUIDE 6FR XBLD 3.5 (CATHETERS) IMPLANT
DEVICE RAD COMP TR BAND LRG (VASCULAR PRODUCTS) IMPLANT
GLIDESHEATH SLEND SS 6F .021 (SHEATH) IMPLANT
GUIDEWIRE INQWIRE 1.5J.035X260 (WIRE) IMPLANT
KIT ENCORE 26 ADVANTAGE (KITS) IMPLANT
KIT ESSENTIALS PG (KITS) IMPLANT
PACK CARDIAC CATHETERIZATION (CUSTOM PROCEDURE TRAY) ×1 IMPLANT
SET ATX-X65L (MISCELLANEOUS) IMPLANT
SHEATH PROBE COVER 6X72 (BAG) IMPLANT
STENT ONYX FRONTIER 2.5X18 (Permanent Stent) IMPLANT
STENT ONYX FRONTIER 3.0X22 (Permanent Stent) IMPLANT
WIRE ASAHI PROWATER 180CM (WIRE) IMPLANT
WIRE RUNTHROUGH .014X180CM (WIRE) IMPLANT

## 2024-08-26 NOTE — Progress Notes (Signed)
 Sent mychart message after several failed attempts to reach on phone.

## 2024-08-26 NOTE — Plan of Care (Signed)
  Problem: Education: Goal: Understanding of CV disease, CV risk reduction, and recovery process will improve Outcome: Progressing   Problem: Cardiovascular: Goal: Vascular access site(s) Level 0-1 will be maintained Outcome: Progressing   Problem: Clinical Measurements: Goal: Ability to maintain clinical measurements within normal limits will improve Outcome: Progressing Goal: Will remain free from infection Outcome: Progressing

## 2024-08-26 NOTE — Interval H&P Note (Signed)
 History and Physical Interval Note:  08/26/2024 5:34 PM  Andrew Newton  has presented today for surgery, with the diagnosis of angina.  The various methods of treatment have been discussed with the patient and family. After consideration of risks, benefits and other options for treatment, the patient has consented to  Procedure(s): LEFT HEART CATH AND CORONARY ANGIOGRAPHY (N/A) CORONARY STENT INTERVENTION (N/A) CORONARY IMAGING/OCT (N/A) as a surgical intervention.     The patient's history has been reviewed, patient examined, no change in status, stable for surgery.  I have reviewed the patient's chart and labs.  Questions were answered to the patient's satisfaction.     Cath Lab Visit (complete for each Cath Lab visit)  Clinical Evaluation Leading to the Procedure:   ACS: Yes.    Non-ACS:    Anginal Classification: CCS III  Anti-ischemic medical therapy: Minimal Therapy (1 class of medications)  Non-Invasive Test Results: No non-invasive testing performed  Prior CABG: No previous CABG     Alm Clay

## 2024-08-27 ENCOUNTER — Other Ambulatory Visit (HOSPITAL_COMMUNITY): Payer: Self-pay

## 2024-08-27 ENCOUNTER — Telehealth (HOSPITAL_COMMUNITY): Payer: Self-pay

## 2024-08-27 ENCOUNTER — Encounter (HOSPITAL_COMMUNITY): Payer: Self-pay

## 2024-08-27 ENCOUNTER — Encounter (HOSPITAL_COMMUNITY): Payer: Self-pay | Admitting: Cardiology

## 2024-08-27 DIAGNOSIS — I25110 Atherosclerotic heart disease of native coronary artery with unstable angina pectoris: Secondary | ICD-10-CM | POA: Diagnosis not present

## 2024-08-27 DIAGNOSIS — E785 Hyperlipidemia, unspecified: Secondary | ICD-10-CM | POA: Insufficient documentation

## 2024-08-27 DIAGNOSIS — I200 Unstable angina: Secondary | ICD-10-CM

## 2024-08-27 MED ORDER — CLOPIDOGREL BISULFATE 75 MG PO TABS
75.0000 mg | ORAL_TABLET | Freq: Every day | ORAL | 1 refills | Status: DC
  Filled 2024-08-27: qty 90, 90d supply, fill #0

## 2024-08-27 MED ORDER — ROSUVASTATIN CALCIUM 40 MG PO TABS
40.0000 mg | ORAL_TABLET | Freq: Every day | ORAL | 1 refills | Status: DC
  Filled 2024-08-27: qty 90, 90d supply, fill #0

## 2024-08-27 MED ORDER — ASPIRIN 81 MG PO TBEC
81.0000 mg | DELAYED_RELEASE_TABLET | Freq: Every day | ORAL | 1 refills | Status: AC
  Filled 2024-08-27: qty 90, 90d supply, fill #0

## 2024-08-27 MED ORDER — ASPIRIN 81 MG PO TBEC
81.0000 mg | DELAYED_RELEASE_TABLET | Freq: Every day | ORAL | Status: DC
  Administered 2024-08-27: 81 mg via ORAL
  Filled 2024-08-27: qty 1

## 2024-08-27 MED ORDER — METOPROLOL TARTRATE 25 MG PO TABS
12.5000 mg | ORAL_TABLET | Freq: Two times a day (BID) | ORAL | 2 refills | Status: DC
  Filled 2024-08-27: qty 30, 30d supply, fill #0

## 2024-08-27 NOTE — Telephone Encounter (Signed)
 Office referral received for Cardiac rehab.Andrew Newton

## 2024-08-27 NOTE — Plan of Care (Signed)
 Patient TR Band was very hard to stop bleeding, took it off around 0400. Educated patient on being very careful with the arm and not using it or lifting things with R arm for a couple of days  Problem: Education: Goal: Understanding of CV disease, CV risk reduction, and recovery process will improve Outcome: Progressing   Problem: Activity: Goal: Ability to return to baseline activity level will improve Outcome: Progressing

## 2024-08-27 NOTE — Telephone Encounter (Signed)
Attempted to call patient in regards to Cardiac Rehab - LM on VM   Sent letter 

## 2024-08-27 NOTE — Discharge Summary (Addendum)
 Discharge Summary   Patient ID: Andrew Newton MRN: [id number]; DOB: [birth year]  Admit date: 08/26/2024 Discharge date: 08/27/2024  PCP:  Sheryle Carwin, MD   [facility] Providers Cardiologist:  Alm Clay, MD  Cardiology APP:  Elaine Miriam BRAVO, NP    Discharge Diagnoses  Principal Problem:   CAD S/P percutaneous coronary angioplasty Active Problems:   Hyperlipidemia   Diagnostic Studies/Procedures   Cath: 08/26/2024    Ost Cx to Prox Cx lesion is 30% stenosed.   1st Mrg lesion is 90% stenosed.  (Diminutive vessel, not large enough for intervention)   Mid Cx to Dist Cx lesion is 20% stenosed with 95% stenosed side branch in 2nd Mrg (Dist Cx lesion beyond the sidebranch is 20% stenosed).  2nd Mrg lesion is 75% stenosed.   A drug-eluting stent was successfully placed covering the 75% lesion, using a STENT ONYX FRONTIER 2.5X18.  Postdilated to 2.8 mm.  Post intervention, there is a 0% residual stenosis. TIMI-3 flow maintained.   A second drug-eluting stent was successfully placed, using a STENT ONYX FRONTIER 3.0X22 in the main branch and side branch (LCx into OM 2 overlapping previous stent).  Stent was postdilated proximally to 3.6 mm in the LCx and 3.3 mm throughout the OM including overlap. Post intervention, there is a 0% residual stenosis.  TIMI-3 flow maintained   -----------------------------------------------------   Mid LAD lesion is 60% stenosed.   Mid RCA lesion is 50% stenosed.  Mid RCA to Dist RCA lesion is 100% stenosed with 100% stenosed side branch in RPAV.   -----------------------------------------------------   Diagnostic    Dominance: Right                                                             Intervention  Severe two-vessel CAD: Mid RCA CTO with some left-to-right collaterals to the distal circulation (this appears to be somewhat subacute) CULPRIT LESION 90% ostial 2nd Mrg (OM2) with follow-on mid OM 2 75% stenosis (minimal involvement of  the main LCx. There is a diminutive OM1 with 90% stenosis that is too small for intervention and the ostial circumflex itself has 30% stenosis. Successful bifurcation PCI with OCT guided stent placement in the mid OM2 (Onyx Frontier DES 2.5 mg 18 mm-postdilated 2.75 mm) overlapped proximally with a stent (Onyx Frontier DES 3.5 mm x 22 mm-postdilated proximally from 3.5 to 3.3 mm) from the LCx into OM 2 postdilated tapered fashion.  Minimal residual stenosis noted in the sidebranch.  TIMI-3 flow restored in all lesions reduced to 0%. LAD has mild diffuse disease proximally with a focal 60% stenosis in the mid vessel. No major branches (just 2 small diagonals proximally. Relatively normal LVEDP with known normal EF by echo.    RECOMMENDATIONS   In the absence of any other complications or medical issues, we expect the patient to be ready for discharge from an interventional cardiology perspective on 08/27/2024.   Further titrate GDMT including increasing Crestor to 40 mg, and add Lopressor 25 mg twice daily Will discuss CTO RCA with Dr. Jordan (CTO operator) to determine feasibility of RCA PCI.   Recommend uninterrupted dual antiplatelet therapy with Aspirin 81mg  daily and Clopidogrel 75mg  daily for a minimum of 6 months (stable ischemic heart disease-Class I recommendation).   Okay to discontinue aspirin after  6 months, and continue Plavix monotherapy to complete 1 year (okay to interrupt at 6 months).  Notably, if the RCA PCI is performed, would then maintain long-term SAPT coverage with Plavix.  Alm Clay, MD _____________   History of Present Illness   Andrew Newton is a 78 y.o. 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   Patient reported in '97 he had swelling to the left leg secondary to  venous insufficiency with 27 blood clots, and that there was a procedure performed to tie off his vein. No documentation present in EMR. Patient unsure whether this was a superficial or deep vein, has not been on long-term anticoagulants.   Seen in the clinic 05/20/2024 with Rollo Louder, no prior cardiac history.  Noted shortness of breath when mowing the lawn in July, reported associated chest tightness on the right side.  Symptoms improved with rest.  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    Echo 06/25/2024: LVEF 60-65% no RWMA, mild LVH, G1 DD, mild-moderate mitral valve regurgitation.  Mild aortic regurgitation.  Regurgitation PHT measuring 599 msec. Aortic dilatation observed in the aortic root (40 mm), mild dilatation of the ascending aorta (42 mm)   Presented to the clinic on 10/23 with reported substernal chest pain/tightness occurring with moderate activity, relieved with rest.  Reported over the last 3 months whenever he goes to push mow his yard he noticed chest tightness radiating to his left shoulder combined with significant DOE and occasional lightheadedness.  Patient rests, take slow breaths, symptoms ease.  Denied symptoms at rest.  Denied SOB at rest, palpitations, dizziness, near-syncope, nausea, dark/tarry/bloody stools, hematuria, leg edema.  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  Per office notes decision was made to proceed with cardiac catheterization.   Hospital Course    Underwent outpatient cardiac catheterization on 10/29 with Dr. Clay with severe two-vessel CAD with CTO of RCA with left-to-right collaterals, culprit lesion of 90% ostial OM 2 followed by 75% stenosis.  Successful bifurcation PCI with OTC guided stent placement in mid OM 2 overlapping proximally with a stent from the circumflex in a tapered fashion.   Recommendations for DAPT with aspirin/Plavix for at least 1 year.  Crestor increased to 40 mg daily and Lopressor 25 mg twice daily added postprocedure.  Observed overnight without complication.  Seen by cardiac rehab.    Did the patient have an acute coronary syndrome (MI, NSTEMI, STEMI, etc) this admission?:  No                               Did the patient have a percutaneous coronary intervention (stent / angioplasty)?:  Yes.     Cath/PCI Registry Performance & Quality Measures: Aspirin prescribed? - Yes ADP Receptor Inhibitor (Plavix/Clopidogrel, Brilinta/Ticagrelor or Effient/Prasugrel) prescribed (includes medically managed patients)? - Yes High Intensity Statin (Lipitor 40-80mg  or Crestor 20-40mg ) prescribed? - Yes For EF <40%, was ACEI/ARB prescribed? - Not Applicable (EF >/= 40%) For EF <40%, Aldosterone Antagonist (Spironolactone or Eplerenone) prescribed? - Not Applicable (EF >/= 40%) Cardiac Rehab Phase II ordered? - Yes       The patient will be scheduled  for a TOC follow up appointment in 10-14 days.  A message has been sent to the [facility] and Scheduling Pool at the office where the patient should be seen for follow up.  _____________  Discharge Vitals Blood pressure 116/68, pulse (!) 58, temperature 98.7 F (37.1 C), temperature source Oral, resp. rate 18, height 5' 11 (1.803 m), weight 117.9 kg, SpO2 99%.  Filed Weights   08/26/24 1004 08/26/24 1046  Weight: 117.9 kg 117.9 kg    Labs & Radiologic Studies  CBC No results for input(s): WBC, NEUTROABS, HGB, HCT, MCV, PLT in the last 72 hours. Basic Metabolic Panel No results for input(s): NA, K, CL, CO2, GLUCOSE, BUN, CREATININE, CALCIUM, MG, PHOS in the last 72 hours. Liver Function Tests No results for input(s): AST, ALT, ALKPHOS, BILITOT, PROT, ALBUMIN in the last 72 hours. No results for input(s): LIPASE, AMYLASE in the last 72 hours. High Sensitivity Troponin:    No results for input(s): TROPONINIHS in the last 720 hours.  No results for input(s): TRNPT in the last 720 hours.  BNP Invalid input(s): POCBNP No results for input(s): PROBNP in the last 72 hours.  No results for input(s): BNP in the last 72 hours.  D-Dimer No results for input(s): DDIMER in the last 72 hours. Hemoglobin A1C No results for input(s): HGBA1C in the last 72 hours. Fasting Lipid Panel No results for input(s): CHOL, HDL, LDLCALC, TRIG, CHOLHDL, LDLDIRECT in the last 72 hours. No results found for: LIPOA  Thyroid Function Tests No results for input(s): TSH, T4TOTAL, T3FREE, THYROIDAB in the last 72 hours.  Invalid input(s): FREET3 _____________  CARDIAC CATHETERIZATION Result Date: 08/26/2024 Images from the original result were not included.   Ost Cx to Prox Cx lesion is 30% stenosed.   1st Mrg lesion is 90% stenosed.  (Diminutive vessel, not large enough for intervention)   Mid Cx to Dist Cx lesion is 20% stenosed with 95% stenosed side branch in 2nd Mrg (Dist Cx lesion beyond the sidebranch is 20% stenosed).  2nd Mrg lesion is 75% stenosed.   A drug-eluting stent was successfully placed covering the 75% lesion, using a STENT ONYX FRONTIER 2.5X18.  Postdilated to 2.8 mm.  Post intervention, there is a 0% residual stenosis. TIMI-3 flow maintained.   A second drug-eluting stent was successfully placed, using a STENT ONYX FRONTIER 3.0X22 in the main branch and side branch (LCx into OM 2 overlapping previous stent).  Stent was postdilated proximally to 3.6 mm in the LCx and 3.3 mm throughout the OM including overlap. Post intervention, there is a 0% residual stenosis.  TIMI-3 flow maintained   -----------------------------------------------------   Mid LAD lesion is 60% stenosed.   Mid RCA lesion is 50% stenosed.  Mid RCA to Dist RCA lesion is 100% stenosed with 100% stenosed side branch in RPAV.    ----------------------------------------------------- Diagnostic Dominance: Right      Intervention Severe two-vessel CAD: Mid RCA CTO with some left-to-right collaterals to the distal circulation (this appears to be somewhat subacute) CULPRIT LESION 90% ostial 2nd Mrg (OM2) with follow-on mid OM 2 75% stenosis (minimal involvement of the main LCx. There is a diminutive OM1 with 90% stenosis that is too small for intervention and the ostial circumflex itself has 30% stenosis. Successful bifurcation PCI with OCT guided stent placement in the mid OM2 (Onyx Frontier DES 2.5 mg 18 mm-postdilated 2.75 mm) overlapped proximally with a stent (Onyx Frontier DES 3.5 mm x 22 mm-postdilated proximally from 3.5 to 3.3 mm)  from the LCx into OM 2 postdilated tapered fashion.  Minimal residual stenosis noted in the sidebranch.  TIMI-3 flow restored in all lesions reduced to 0%. LAD has mild diffuse disease proximally with a focal 60% stenosis in the mid vessel. No major branches (just 2 small diagonals proximally. Relatively normal LVEDP with known normal EF by echo. RECOMMENDATIONS   In the absence of any other complications or medical issues, we expect the patient to be ready for discharge from an interventional cardiology perspective on 08/27/2024.   Further titrate GDMT including increasing Crestor to 40 mg, and add Lopressor 25 mg twice daily Will discuss CTO RCA with Dr. Jordan (CTO operator) to determine feasibility of RCA PCI.   Recommend uninterrupted dual antiplatelet therapy with Aspirin 81mg  daily and Clopidogrel 75mg  daily for a minimum of 6 months (stable ischemic heart disease-Class I recommendation).   Okay to discontinue aspirin after 6 months, and continue Plavix monotherapy to complete 1 year (okay to interrupt at 6 months).  Notably, if the RCA PCI is performed, would then maintain long-term SAPT coverage with Plavix.  Alm Clay, MD    Disposition Pt is being discharged home today in good  condition.  Follow-up Plans & Appointments  Discharge Instructions     AMB referral to Phase II Cardiac Rehabilitation   Complete by: As directed    Diagnosis:  Stable Angina Coronary Stents     After initial evaluation and assessments completed: Virtual Based Care may be provided alone or in conjunction with Phase 2 Cardiac Rehab based on patient barriers.: Yes   Intensive Cardiac Rehabilitation (ICR) [facility] location only OR Traditional Cardiac Rehabilitation (TCR) *If criteria for ICR are not met will enroll in TCR [facility] only): Yes   Call MD for:  difficulty breathing, headache or visual disturbances   Complete by: As directed    Call MD for:  persistant dizziness or light-headedness   Complete by: As directed    Call MD for:  redness, tenderness, or signs of infection (pain, swelling, redness, odor or green/yellow discharge around incision site)   Complete by: As directed    Diet - low sodium heart healthy   Complete by: As directed    Discharge instructions   Complete by: As directed    Radial Site Care Refer to this sheet in the next few weeks. These instructions provide you with information on caring for yourself after your procedure. Your caregiver may also give you more specific instructions. Your treatment has been planned according to current medical practices, but problems sometimes occur. Call your caregiver if you have any problems or questions after your procedure. HOME CARE INSTRUCTIONS You may shower the day after the procedure. Remove the bandage (dressing) and gently wash the site with plain soap and water . Gently pat the site dry.  Do not apply powder or lotion to the site.  Do not submerge the affected site in water  for 3 to 5 days.  Inspect the site at least twice daily.  Do not flex or bend the affected arm for 24 hours.  No lifting over 5 pounds (2.3 kg) for 5 days after your procedure.  Do not drive home if you are discharged the same day of the procedure. Have  someone else drive you.  You may drive 24 hours after the procedure unless otherwise instructed by your caregiver.  What to expect: Any bruising will usually fade within 1 to 2 weeks.  Blood that collects in the tissue (hematoma) may be painful  to the touch. It should usually decrease in size and tenderness within 1 to 2 weeks.  SEEK IMMEDIATE MEDICAL CARE IF: You have unusual pain at the radial site.  You have redness, warmth, swelling, or pain at the radial site.  You have drainage (other than a small amount of blood on the dressing).  You have chills.  You have a fever or persistent symptoms for more than 72 hours.  You have a fever and your symptoms suddenly get worse.  Your arm becomes pale, cool, tingly, or numb.  You have heavy bleeding from the site. Hold pressure on the site.   PLEASE DO NOT MISS ANY DOSES OF YOUR PLAVIX!!!!! Also keep a log of you blood pressures and bring back to your follow up appt. Please call the office with any questions.   Patients taking blood thinners should generally stay away from medicines like ibuprofen, Advil, Motrin, naproxen, and Aleve due to risk of stomach bleeding. You may take Tylenol  as directed or talk to your primary doctor about alternatives.   PLEASE ENSURE THAT YOU DO NOT RUN OUT OF YOUR PLAVIX. This medication is very important to remain on for at least 6months. IF you have issues obtaining this medication due to cost please CALL the office 3-5 business days prior to running out in order to prevent missing doses of this medication.   Increase activity slowly   Complete by: As directed        Discharge Medications Allergies as of 08/27/2024   No Known Allergies      Medication List     STOP taking these medications    ibuprofen 200 MG tablet Commonly known as: ADVIL       TAKE these medications    aspirin EC 81 MG tablet Take 1 tablet (81 mg total) by mouth daily. Swallow whole. What changed:  when to take  this additional instructions   clopidogrel 75 MG tablet Commonly known as: PLAVIX Take 1 tablet (75 mg total) by mouth daily with breakfast.   metoprolol tartrate 25 MG tablet Commonly known as: LOPRESSOR Take 0.5 tablets (12.5 mg total) by mouth 2 (two) times daily.   nitroGLYCERIN 0.4 MG SL tablet Commonly known as: NITROSTAT Place 1 tablet (0.4 mg total) under the tongue every 5 (five) minutes as needed.   rosuvastatin 40 MG tablet Commonly known as: CRESTOR Take 1 tablet (40 mg total) by mouth daily. What changed:  medication strength how much to take         Outstanding Labs/Studies N/a   Duration of Discharge Encounter: APP Time: 20 minutes   Signed, Manuelita Rummer, NP 08/27/2024, 9:16 AM  Debby Sicks was seen by me today along with Manuelita Rummer, NP. I have personally performed an evaluation on this patient.  My findings are as follows: 78 y.o. male with CAD and HLD who was admitted post outpatient cardiac cath following PCI of the Circ/OM. He also has CTO of the distal RCA. Moderate LAD stenosis.  Doing well today. No chest pain  Data: EKG(s) and pertinent labs, studies, etc were personally reviewed and interpreted by me:  No am EKG Tele: sinus Labs reviewed by me Otherwise, I agree with data as outlined by the advanced practice provider.  Exam performed by me: Gen: NAD Neck: No JVD Cardiac: RRR Lungs: clear bilaterally Extremities: No LE edema  My Assessment and Plan:  CAD with unstable angina: Post PCI/stenting of the circ/OM. Doing well. No chest pain. D/c home today on  ASA, Plavix, beta blocker and statin.   I have spent 25 minutes on chart review note review, lab review, tele review, cath film review, examination and plan formulation with note construction.   Signed,  Lonni Cash, MD  08/27/2024 9:46 AM

## 2024-08-28 LAB — LIPOPROTEIN A (LPA): Lipoprotein (a): 63.6 nmol/L — ABNORMAL HIGH (ref <75.0)

## 2024-09-10 ENCOUNTER — Telehealth (HOSPITAL_COMMUNITY): Payer: Self-pay

## 2024-09-10 ENCOUNTER — Encounter: Payer: Self-pay | Admitting: Nurse Practitioner

## 2024-09-10 ENCOUNTER — Ambulatory Visit: Attending: Nurse Practitioner | Admitting: Nurse Practitioner

## 2024-09-10 VITALS — BP 128/80 | HR 57 | Ht 71.0 in | Wt 262.0 lb

## 2024-09-10 DIAGNOSIS — I340 Nonrheumatic mitral (valve) insufficiency: Secondary | ICD-10-CM | POA: Diagnosis not present

## 2024-09-10 DIAGNOSIS — I351 Nonrheumatic aortic (valve) insufficiency: Secondary | ICD-10-CM

## 2024-09-10 DIAGNOSIS — Z86718 Personal history of other venous thrombosis and embolism: Secondary | ICD-10-CM | POA: Diagnosis not present

## 2024-09-10 DIAGNOSIS — Z136 Encounter for screening for cardiovascular disorders: Secondary | ICD-10-CM

## 2024-09-10 DIAGNOSIS — I2510 Atherosclerotic heart disease of native coronary artery without angina pectoris: Secondary | ICD-10-CM

## 2024-09-10 DIAGNOSIS — I77810 Thoracic aortic ectasia: Secondary | ICD-10-CM

## 2024-09-10 DIAGNOSIS — E785 Hyperlipidemia, unspecified: Secondary | ICD-10-CM | POA: Diagnosis not present

## 2024-09-10 MED ORDER — METOPROLOL TARTRATE 25 MG PO TABS: 12.5000 mg | ORAL_TABLET | Freq: Two times a day (BID) | ORAL | 3 refills | Status: AC

## 2024-09-10 MED ORDER — CLOPIDOGREL BISULFATE 75 MG PO TABS
75.0000 mg | ORAL_TABLET | Freq: Every day | ORAL | 3 refills | Status: AC
Start: 2024-09-10 — End: ?

## 2024-09-10 MED ORDER — ROSUVASTATIN CALCIUM 40 MG PO TABS: 40.0000 mg | ORAL_TABLET | Freq: Every day | ORAL | 3 refills | Status: AC

## 2024-09-10 NOTE — Telephone Encounter (Signed)
No response from pt in regards to Cardiac Rehab. Closed referral               

## 2024-09-10 NOTE — Progress Notes (Signed)
 Office Visit    Patient Name: Andrew Newton Date of Encounter: 09/10/2024  Primary Care Provider:  Sheryle Carwin, MD Primary Cardiologist:  Alm Clay, MD  Chief Complaint    78 year old male with a history of CAD s/p DES x 2 overlapping-OM2, CTO-RCA managed medically, prior DVT, mitral valve regurgitation, aortic valve regurgitation, mild dilation of the ascending aorta, and hyperlipidemia who presents for follow-up related to CAD s/p DES x 2 overlapping.  Past Medical History    Past Medical History:  Diagnosis Date   Arthritis    Blood clot in vein 1997   Past Surgical History:  Procedure Laterality Date   COLONOSCOPY N/A 01/11/2016   Procedure: COLONOSCOPY;  Surgeon: Claudis RAYMOND Rivet, MD;  Location: [facility];  Service: Endoscopy;  Laterality: N/A;  730   CORONARY IMAGING/OCT N/A 08/26/2024   Procedure: CORONARY IMAGING/OCT;  Surgeon: Clay Alm ORN, MD;  Location: [facility] INVASIVE CV LAB;  Service: Cardiovascular;  Laterality: N/A;   CORONARY STENT INTERVENTION N/A 08/26/2024   Procedure: CORONARY STENT INTERVENTION;  Surgeon: Clay Alm ORN, MD;  Location: [facility] INVASIVE CV LAB;  Service: Cardiovascular;  Laterality: N/A;   INGUINAL HERNIA REPAIR Left 05/24/2017   Procedure: LEFT INGUINAL HERNIORRHAPHY WITH MESH;  Surgeon: Mavis Anes, MD;  Location: [facility] ORS;  Service: General;  Laterality: Left;   LEFT HEART CATH AND CORONARY ANGIOGRAPHY N/A 08/26/2024   Procedure: LEFT HEART CATH AND CORONARY ANGIOGRAPHY;  Surgeon: Clay Alm ORN, MD;  Location: [facility] INVASIVE CV LAB;  Service: Cardiovascular;  Laterality: N/A;   NECK SURGERY     VASCULAR SURGERY      Allergies  No Known Allergies   Labs/Other Studies Reviewed    The following studies were reviewed today:  Cardiac Studies & Procedures   ______________________________________________________________________________________________ CARDIAC CATHETERIZATION  CARDIAC CATHETERIZATION  08/26/2024  Conclusion Images from the original result were not included.    Ost Cx to Prox Cx lesion is 30% stenosed.   1st Mrg lesion is 90% stenosed.  (Diminutive vessel, not large enough for intervention)   Mid Cx to Dist Cx lesion is 20% stenosed with 95% stenosed side branch in 2nd Mrg (Dist Cx lesion beyond the sidebranch is 20% stenosed).  2nd Mrg lesion is 75% stenosed.   A drug-eluting stent was successfully placed covering the 75% lesion, using a STENT ONYX FRONTIER 2.5X18.  Postdilated to 2.8 mm.  Post intervention, there is a 0% residual stenosis. TIMI-3 flow maintained.   A second drug-eluting stent was successfully placed, using a STENT ONYX FRONTIER 3.0X22 in the main branch and side branch (LCx into OM 2 overlapping previous stent).  Stent was postdilated proximally to 3.6 mm in the LCx and 3.3 mm throughout the OM including overlap. Post intervention, there is a 0% residual stenosis.  TIMI-3 flow maintained   -----------------------------------------------------   Mid LAD lesion is 60% stenosed.   Mid RCA lesion is 50% stenosed.  Mid RCA to Dist RCA lesion is 100% stenosed with 100% stenosed side branch in RPAV.   -----------------------------------------------------  Diagnostic Dominance: Right      Intervention  Severe two-vessel CAD: Mid RCA CTO with some left-to-right collaterals to the distal circulation (this appears to be somewhat subacute) CULPRIT LESION 90% ostial 2nd Mrg (OM2) with follow-on mid OM 2 75% stenosis (minimal involvement of the main LCx. There is a diminutive OM1 with 90% stenosis that is too small for intervention and the ostial circumflex itself has 30% stenosis. Successful bifurcation PCI with OCT guided  stent placement in the mid OM2 (Onyx Frontier DES 2.5 mg 18 mm-postdilated 2.75 mm) overlapped proximally with a stent (Onyx Frontier DES 3.5 mm x 22 mm-postdilated proximally from 3.5 to 3.3 mm) from the LCx into OM 2 postdilated tapered fashion.   Minimal residual stenosis noted in the sidebranch.  TIMI-3 flow restored in all lesions reduced to 0%. LAD has mild diffuse disease proximally with a focal 60% stenosis in the mid vessel. No major branches (just 2 small diagonals proximally. Relatively normal LVEDP with known normal EF by echo.   RECOMMENDATIONS   In the absence of any other complications or medical issues, we expect the patient to be ready for discharge from an interventional cardiology perspective on 08/27/2024.   Further titrate GDMT including increasing Crestor to 40 mg, and add Lopressor 25 mg twice daily Will discuss CTO RCA with Dr. Jordan (CTO operator) to determine feasibility of RCA PCI.   Recommend uninterrupted dual antiplatelet therapy with Aspirin 81mg  daily and Clopidogrel 75mg  daily for a minimum of 6 months (stable ischemic heart disease-Class I recommendation).   Okay to discontinue aspirin after 6 months, and continue Plavix monotherapy to complete 1 year (okay to interrupt at 6 months).  Notably, if the RCA PCI is performed, would then maintain long-term SAPT coverage with Plavix.    Alm Clay, MD  Findings Coronary Findings Diagnostic  Dominance: Right  Left Anterior Descending There is mild diffuse disease throughout the vessel. Mid LAD lesion is 60% stenosed.  First Diagonal Branch Vessel is small in size.  Second Diagonal Branch Vessel is small in size.  Left Circumflex Vessel is large. Ost Cx to Prox Cx lesion is 30% stenosed. Mid Cx to Dist Cx lesion is 20% stenosed with 95% stenosed side branch in 2nd Mrg. Dist Cx lesion is 20% stenosed.  First Obtuse Marginal Branch Vessel is small in size. 1st Mrg lesion is 90% stenosed.  Second Obtuse Marginal Branch Vessel is large in size. 2nd Mrg lesion is 75% stenosed. Vessel is not the culprit lesion. The lesion is type A, focal and concentric. Optical coherence tomography (OCT) was performed. Minimum lumen area: 1.2 mm.  Right  Coronary Artery Vessel was injected. Vessel is large. There is severe diffuse disease throughout the vessel. There is severe focal disease in the vessel. Mid RCA lesion is 50% stenosed. Mid RCA to Dist RCA lesion is 100% stenosed with 100% stenosed side branch in RPAV.  Right Posterior Descending Artery Collaterals RPDA filled by collaterals from 2nd Sept.  First Right Posterolateral Branch Collaterals 1st RPL filled by collaterals from 3rd Mrg.  Second Right Posterolateral Branch Collaterals 2nd RPL filled by collaterals from 2nd Mrg.  Intervention  Mid Cx to Dist Cx lesion with side branch in 2nd Mrg Provisional - Main and Side Branches (Also treats lesions: 2nd Mrg) Provisional stenting was successfully performed in the main branch from the Mid Cx to the Dist Cx and in the side branch at the 2nd Mrg. Pre-stent angioplasty was performed in the main branch and side branch. Balloon went from main branch into OM 2 A BALLOON EMERGE MR 3.0X12 semi-compliant balloon was used in the main branch. Maximum pressure: 12 atm. Inflation time: 20 sec. A BALLOON EMERGE MR 3.0X12 non-compliant balloon was used in the side branch. Maximum pressure: 12 atm. Inflation time: 20 sec. A STENT ONYX FRONTIER 3.0X22 drug-eluting stent was successfully placed in the main branch and side branch. From main LCx into OM 2 Maximum pressure: 18 atm. Inflation  time: 30 sec. Post-stent angioplasty was performed in the main branch and side branch. Noncompliant balloon used in main branch into sidebranch; semicompliant mostly in main branch A BALLOON EMERGE MR 3.5X12 semi-compliant balloon was used in the main branch. Maximum pressure: 16 atm. Inflation time: 20 sec. A BALLOON SAPPHIRE NC24 3.25X15 non-compliant balloon was used in the side branch. Maximum pressure: 20 atm. Inflation time: 20 sec. After final OCT, the proximal portion of the stent in the main branch was not fully opposed I therefore used a 3.5 mm x 12 mm  semicompliant balloon to 16 atm x 20 seconds. Post-Intervention Lesion Assessment The intervention was successful. Pre-interventional TIMI flow is 3. Post-intervention TIMI flow is 3. Treated lesion length:  20 mm. No complications occurred at this lesion. After stent placement across the main channel LCx, there was minimal residual stenosis. There is a 0% residual stenosis in the main branch post intervention. There is a 0% residual stenosis in the side branch post intervention.  2nd Mrg lesion Stent Lesion length:  17 mm. CATH VISTA GUIDE 6FR XBLD 3.5 guide catheter was inserted. Lesion crossed with guidewire using a WIRE RUNTHROUGH .[id number]. Pre-stent angioplasty was not performed. A drug-eluting stent was successfully placed using a STENT ONYX FRONTIER 2.5X18. Maximum pressure: 18 atm. Inflation time: 30 sec. Stent strut is well apposed. Dilated 2.75 mm Post-stent angioplasty was performed using a BALLOON SAPPHIRE NC24 [id number]. Maximum pressure:  18 atm. Inflation time:  20 sec.  A second balloon was used, using a BALLOON SAPPHIRE NC24 [id number]. Maximum pressure:  18 atm. Inflation time:  20 sec. WIRE ASAHI PROWATER 180CM  used to wire the distal LCx into OM 3 to protect sidebranch Provisional (Also treats lesions: Mid Cx to Dist Cx with side branch in 2nd Mrg) Provisional stenting was successfully performed in the main branch from the Mid Cx to the Dist Cx and in the side branch at the 2nd Mrg. Pre-stent angioplasty was performed in the main branch and side branch. Balloon went from main branch into OM 2 A BALLOON EMERGE MR 3.0X12 semi-compliant balloon was used in the main branch. Maximum pressure: 12 atm. Inflation time: 20 sec. A BALLOON EMERGE MR 3.0X12 non-compliant balloon was used in the side branch. Maximum pressure: 12 atm. Inflation time: 20 sec. A STENT ONYX FRONTIER 3.0X22 drug-eluting stent was successfully placed in the main branch and side branch. From main LCx into OM 2 Maximum  pressure: 18 atm. Inflation time: 30 sec. Post-stent angioplasty was performed in the main branch and side branch. Noncompliant balloon used in main branch into sidebranch; semicompliant mostly in main branch A BALLOON EMERGE MR 3.5X12 semi-compliant balloon was used in the main branch. Maximum pressure: 16 atm. Inflation time: 20 sec. A BALLOON SAPPHIRE NC24 3.25X15 non-compliant balloon was used in the side branch. Maximum pressure: 20 atm. Inflation time: 20 sec. After final OCT, the proximal portion of the stent in the main branch was not fully opposed I therefore used a 3.5 mm x 12 mm semicompliant balloon to 16 atm x 20 seconds. Post-Intervention Lesion Assessment The intervention was successful. Pre-interventional TIMI flow is 3. Post-intervention TIMI flow is 3. Treated lesion length:  18 mm. No complications occurred at this lesion. There is a 0% residual stenosis post intervention.     ECHOCARDIOGRAM  ECHOCARDIOGRAM COMPLETE 06/25/2024  Narrative ECHOCARDIOGRAM REPORT    Patient Name:   Andrew Newton Date of Exam: 06/25/2024 Medical Rec #:  [id number]  Height:       72.0 in Accession #:    [id number]         Weight:       259.9 lb Date of Birth:  1946-06-03           BSA:          2.381 m Patient Age:    78 years           BP:           124/68 mmHg Patient Gender: M                  HR:           65 bpm. Exam Location:  [address]  Procedure: 2D Echo, Cardiac Doppler and Color Doppler (Both Spectral and Color Flow Doppler were utilized during procedure).  Indications:    R06.02 SOB  History:        Patient has no prior history of Echocardiogram examinations. Arrythmias:PVC, Signs/Symptoms:Shortness of Breath; Risk Factors:Dyslipidemia.  Sonographer:    Elsie Bohr RDCS Referring Phys: [id number] [facility]   Sonographer Comments: Suboptimal parasternal window and suboptimal subcostal window. IMPRESSIONS   1. Left ventricular ejection fraction,  by estimation, is 60 to 65%. The left ventricle has normal function. The left ventricle has no regional wall motion abnormalities. There is mild concentric left ventricular hypertrophy. Left ventricular diastolic parameters are consistent with Grade I diastolic dysfunction (impaired relaxation). 2. Right ventricular systolic function is normal. The right ventricular size is normal. Tricuspid regurgitation signal is inadequate for assessing PA pressure. 3. The mitral valve is normal in structure. Mild to moderate mitral valve regurgitation. No evidence of mitral stenosis. 4. The aortic valve is normal in structure. Aortic valve regurgitation is mild. No aortic stenosis is present. Aortic regurgitation PHT measures 599 msec. 5. Aortic dilatation noted. There is mild dilatation of the aortic root, measuring 40 mm. There is mild dilatation of the ascending aorta, measuring 42 mm. 6. The inferior vena cava is normal in size with greater than 50% respiratory variability, suggesting right atrial pressure of 3 mmHg.  FINDINGS Left Ventricle: Left ventricular ejection fraction, by estimation, is 60 to 65%. The left ventricle has normal function. The left ventricle has no regional wall motion abnormalities. The left ventricular internal cavity size was normal in size. There is mild concentric left ventricular hypertrophy. Left ventricular diastolic parameters are consistent with Grade I diastolic dysfunction (impaired relaxation). Normal left ventricular filling pressure.  Right Ventricle: The right ventricular size is normal. No increase in right ventricular wall thickness. Right ventricular systolic function is normal. Tricuspid regurgitation signal is inadequate for assessing PA pressure.  Left Atrium: Left atrial size was normal in size.  Right Atrium: Right atrial size was normal in size.  Pericardium: There is no evidence of pericardial effusion.  Mitral Valve: The mitral valve is normal in  structure. Mild to moderate mitral valve regurgitation. No evidence of mitral valve stenosis.  Tricuspid Valve: The tricuspid valve is normal in structure. Tricuspid valve regurgitation is not demonstrated. No evidence of tricuspid stenosis.  Aortic Valve: The aortic valve is normal in structure. Aortic valve regurgitation is mild. Aortic regurgitation PHT measures 599 msec. No aortic stenosis is present.  Pulmonic Valve: The pulmonic valve was normal in structure. Pulmonic valve regurgitation is mild. No evidence of pulmonic stenosis.  Aorta: Aortic dilatation noted. There is mild dilatation of the aortic root, measuring 40 mm. There is mild dilatation of the  ascending aorta, measuring 42 mm.  Venous: The inferior vena cava is normal in size with greater than 50% respiratory variability, suggesting right atrial pressure of 3 mmHg.  IAS/Shunts: No atrial level shunt detected by color flow Doppler.   LEFT VENTRICLE PLAX 2D LVIDd:         5.20 cm   Diastology LVIDs:         3.70 cm   LV e' medial:    6.31 cm/s LV PW:         1.40 cm   LV E/e' medial:  8.3 LV IVS:        1.30 cm   LV e' lateral:   7.51 cm/s LVOT diam:     2.20 cm   LV E/e' lateral: 7.0 LV SV:         71 LV SV Index:   30 LVOT Area:     3.80 cm   RIGHT VENTRICLE             IVC RV S prime:     15.90 cm/s  IVC diam: 1.58 cm TAPSE (M-mode): 2.6 cm  LEFT ATRIUM             Index        RIGHT ATRIUM           Index LA diam:        4.60 cm 1.93 cm/m   RA Pressure: 3.00 mmHg LA Vol (A2C):   51.0 ml 21.42 ml/m  RA Area:     13.00 cm LA Vol (A4C):   68.9 ml 28.93 ml/m  RA Volume:   29.80 ml  12.51 ml/m LA Biplane Vol: 62.9 ml 26.41 ml/m AORTIC VALVE LVOT Vmax:   90.70 cm/s LVOT Vmean:  58.700 cm/s LVOT VTI:    0.186 m AI PHT:      599 msec  AORTA Ao Root diam: 4.00 cm Ao Asc diam:  4.20 cm  MITRAL VALVE               TRICUSPID VALVE MV Area (PHT): 2.80 cm    Estimated RAP:  3.00 mmHg MV Decel Time: 271  msec MV E velocity: 52.60 cm/s  SHUNTS MV A velocity: 69.10 cm/s  Systemic VTI:  0.19 m MV E/A ratio:  0.76        Systemic Diam: 2.20 cm  Wilbert Bihari MD Electronically signed by Wilbert Bihari MD Signature Date/Time: 06/26/2024/10:23:25 AM    Final          ______________________________________________________________________________________________     Recent Labs: 08/20/2024: ALT 22; BUN 15; Creatinine, Ser 1.16; Hemoglobin 13.7; Platelets 183; Potassium 4.9; Sodium 140  Recent Lipid Panel No results found for: CHOL, TRIG, HDL, CHOLHDL, VLDL, LDLCALC, LDLDIRECT  History of Present Illness    78 year old male with the above past medical history including CAD s/p DES x 2 overlapping-OM2, CTO-RCA managed medically, prior DVT, mitral valve regurgitation, aortic valve regurgitation, mild dilation of the ascending aorta, and hyperlipidemia.  He established with cardiology in July 2025 in the setting of chest pain, dyspnea on exertion.  Echocardiogram in 05/2024 showed EF 60 to 65%, normal LV function, no RWMA, mild concentric LVH, G1 DD, normal RV systolic function, mild to moderate mitral valve regurgitation, mild aortic valve regurgitation, mild dilation of the aortic root measuring 40 mm, mild dilation of ascending aorta measuring 42 mm.  He was last seen in the office on 08/20/2024 and reported ongoing exertional chest pain, moderate dyspnea on exertion.  EKG showed T wave inversion in inferolateral leads, as well as V5/V6.  He underwent cardiac catheterization on 08/26/2024, which revealed 30% ostial to proximal LCx stenosis, 90% OM1 stenosis (too small for PCI), 20% mid to distal LCx, 95% ostial OM 2, 75% mid OM2 s/p DES x 2 overlapping, 60% mid LAD stenosis, 50% mid RCA stenosis, 100% mid to distal RCA stenosis, 100% RPA V stenosis (CTO with some left-to-right collaterals).  Crestor was increased to 40 mg daily.  He was started on metoprolol.  It was noted that CTO-RCA  would be discussed with Dr. Jordan to determine feasibility. He was started on DAPT with aspirin and Plavix.   He presents today for follow-up accompanied by his wife.  Since his procedure he has done well from a cardiac standpoint.  He denies any symptoms concerning for angina.  He reports improved activity tolerance. Overall, he reports feeling well.   Home Medications    Current Outpatient Medications  Medication Sig Dispense Refill   aspirin EC 81 MG tablet Take 1 tablet (81 mg total) by mouth daily. Swallow whole. 90 tablet 1   clopidogrel (PLAVIX) 75 MG tablet Take 1 tablet (75 mg total) by mouth daily with breakfast. 90 tablet 1   metoprolol tartrate (LOPRESSOR) 25 MG tablet Take 0.5 tablets (12.5 mg total) by mouth 2 (two) times daily. 30 tablet 2   nitroGLYCERIN (NITROSTAT) 0.4 MG SL tablet Place 1 tablet (0.4 mg total) under the tongue every 5 (five) minutes as needed. 25 tablet 3   rosuvastatin (CRESTOR) 40 MG tablet Take 1 tablet (40 mg total) by mouth daily. 90 tablet 1   No current facility-administered medications for this visit.     Review of Systems    He denies chest pain, palpitations, dyspnea, pnd, orthopnea, n, v, dizziness, syncope, edema, weight gain, or early satiety. All other systems reviewed and are otherwise negative except as noted above.   Physical Exam    VS:  BP 128/80 (BP Location: Left Arm, Patient Position: Sitting, Cuff Size: Large)   Pulse (!) 57   Ht 5' 11 (1.803 m)   Wt 262 lb (118.8 kg)   SpO2 98%   BMI 36.54 kg/m   GEN: Well nourished, well developed, in no acute distress. HEENT: normal. Neck: Supple, no JVD, carotid bruits, or masses. Cardiac: RRR, 2/6 murmur, no rubs, or gallops. No clubbing, cyanosis, edema.  Radials/DP/PT 2+ and equal bilaterally.  Right radial cath site without bruising, bruit, bleeding or hematoma. Respiratory:  Respirations regular and unlabored, clear to auscultation bilaterally. GI: Soft, nontender, nondistended,  BS + x 4. MS: no deformity or atrophy. Skin: warm and dry, no rash. Neuro:  Strength and sensation are intact. Psych: Normal affect.  Accessory Clinical Findings    ECG personally reviewed by me today - EKG Interpretation Date/Time:  Thursday September 10 2024 09:02:12 EST Ventricular Rate:  57 PR Interval:  186 QRS Duration:  98 QT Interval:  430 QTC Calculation: 418 R Axis:   14  Text Interpretation: Sinus bradycardia Inferior infarct (cited on or before 20-Aug-2024) When compared with ECG of 26-Aug-2024 16:39, No significant change was found Confirmed by Daneen Perkins ([id number]) on 09/10/2024 9:07:11 AM  - no acute changes.   Lab Results  Component Value Date   WBC 6.1 08/20/2024   HGB 13.7 08/20/2024   HCT 43.4 08/20/2024   MCV 88 08/20/2024   PLT 183 08/20/2024   Lab Results  Component Value Date   CREATININE 1.16 08/20/2024  BUN 15 08/20/2024   NA 140 08/20/2024   K 4.9 08/20/2024   CL 104 08/20/2024   CO2 21 08/20/2024   Lab Results  Component Value Date   ALT 22 08/20/2024   AST 24 08/20/2024   ALKPHOS 63 08/20/2024   BILITOT 0.6 08/20/2024   No results found for: CHOL, HDL, LDLCALC, LDLDIRECT, TRIG, CHOLHDL  No results found for: HGBA1C  Assessment & Plan   1. CAD: S/p DES x 2 overlapping-OM2 on 08/26/2024. Residual 30% ostial to proximal LCx stenosis, 90% OM1 stenosis (too small for PCI), 20% mid to distal LCx, 60% mid LAD stenosis, 50% mid RCA stenosis, 100% mid to distal RCA stenosis, 100% RPA V stenosis (CTO with some left-to-right collaterals).  Stable with no anginal symptoms.  Should he have progressive symptoms concerning for angina, consider evaluation of possible PCI-CTO, no indication at this time.  Will reach out to Dr. Anner for possible referral to cardiac rehab.  Encouraged increase activity as tolerated.  Reviewed ED precautions.  Continue aspirin, Plavix, metoprolol, Crestor.  2. History of DVT: No recurrence. No longer on  anticoagulation.   3. Valvular heart disease: Echocardiogram in 05/2024 showed EF 60 to 65%, normal LV function, no RWMA, mild concentric LVH, G1 DD, normal RV systolic function, mild to moderate mitral valve regurgitation, mild aortic valve regurgitation, mild dilation of the aortic root measuring 40 mm, mild dilation of ascending aorta measuring 42 mm.  Asymptomatic.  Consider repeat echocardiogram in 3 to 5 years, sooner if clinically indicated.  4. Mild dilation ascending aorta: Stable on most recent echo.  Consider CT chest/aorta versus repeat echocardiogram in 1 year (05/2025) for routine monitoring.  5. Hyperlipidemia: No recent LDL on file.  Crestor was recently increased to 40 mg daily.  Plan for repeat fasting lipids, LFTs in 6 to 8 weeks.  6. Disposition: Follow-up in 3 months.       Damien JAYSON Braver, NP 09/10/2024, 9:09 AM

## 2024-09-10 NOTE — Patient Instructions (Signed)
 Medication Instructions:  Your physician recommends that you continue on your current medications as directed. Please refer to the Current Medication list given to you today.  *If you need a refill on your cardiac medications before your next appointment, please call your pharmacy*  Lab Work: Fasting lipid & LFTs in 6 weeks  Testing/Procedures: NONE ordered at this time of appointment   Follow-Up: At [facility], you and your health needs are our priority.  As part of our continuing mission to provide you with exceptional heart care, our providers are all part of one team.  This team includes your primary Cardiologist (physician) and Advanced Practice Providers or APPs (Physician Assistants and Nurse Practitioners) who all work together to provide you with the care you need, when you need it.  Your next appointment:   3 month(s)  Provider:   Alm Clay, MD    We recommend signing up for the patient portal called MyChart.  Sign up information is provided on this After Visit Summary.  MyChart is used to connect with patients for Virtual Visits (Telemedicine).  Patients are able to view lab/test results, encounter notes, upcoming appointments, etc.  Non-urgent messages can be sent to your provider as well.   To learn more about what you can do with MyChart, go to [email or web address].

## 2024-09-13 ENCOUNTER — Encounter: Payer: Self-pay | Admitting: Nurse Practitioner

## 2024-10-17 LAB — HEPATIC FUNCTION PANEL
ALT: 26 IU/L (ref 0–44)
AST: 36 IU/L (ref 0–40)
Albumin: 4.3 g/dL (ref 3.8–4.8)
Alkaline Phosphatase: 59 IU/L (ref 47–123)
Bilirubin Total: 0.5 mg/dL (ref 0.0–1.2)
Bilirubin, Direct: 0.18 mg/dL (ref 0.00–0.40)
Total Protein: 7 g/dL (ref 6.0–8.5)

## 2024-10-17 LAB — LIPID PANEL
Chol/HDL Ratio: 2.6 ratio (ref 0.0–5.0)
Cholesterol, Total: 103 mg/dL (ref 100–199)
HDL: 39 mg/dL — ABNORMAL LOW
LDL Chol Calc (NIH): 38 mg/dL (ref 0–99)
Triglycerides: 156 mg/dL — ABNORMAL HIGH (ref 0–149)
VLDL Cholesterol Cal: 26 mg/dL (ref 5–40)

## 2024-10-19 ENCOUNTER — Ambulatory Visit: Payer: Self-pay | Admitting: Nurse Practitioner

## 2025-01-11 ENCOUNTER — Ambulatory Visit: Admitting: Cardiology
